# Patient Record
Sex: Female | Born: 1980 | Race: Black or African American | Hispanic: No | Marital: Single | Smoking: Never smoker
Health system: Southern US, Community
[De-identification: ages and names within clinical notes are randomized; demographics above are authoritative.]

## PROBLEM LIST (undated history)

## (undated) DIAGNOSIS — F411 Generalized anxiety disorder: Secondary | ICD-10-CM

## (undated) DIAGNOSIS — N6122 Granulomatous mastitis, left breast: Secondary | ICD-10-CM

## (undated) HISTORY — PX: BREAST CYST EXCISION: SHX579

## (undated) HISTORY — PX: BREAST CYST ASPIRATION: SHX578

## (undated) HISTORY — PX: BREAST EXCISIONAL BIOPSY: SUR124

## (undated) HISTORY — DX: Generalized anxiety disorder: F41.1

## (undated) HISTORY — DX: Granulomatous mastitis, left breast: N61.22

---

## 2017-05-21 ENCOUNTER — Other Ambulatory Visit: Payer: Self-pay | Admitting: Family Medicine

## 2017-05-21 ENCOUNTER — Other Ambulatory Visit: Payer: Self-pay

## 2017-05-21 DIAGNOSIS — N6310 Unspecified lump in the right breast, unspecified quadrant: Secondary | ICD-10-CM

## 2017-05-26 ENCOUNTER — Other Ambulatory Visit: Payer: Self-pay | Admitting: Physician Assistant

## 2017-05-26 DIAGNOSIS — N6310 Unspecified lump in the right breast, unspecified quadrant: Secondary | ICD-10-CM

## 2017-06-04 ENCOUNTER — Ambulatory Visit
Admission: RE | Admit: 2017-06-04 | Discharge: 2017-06-04 | Disposition: A | Discharge status: Home / Self Care | Payer: Medicaid Other | Source: Ambulatory Visit | Attending: Physician Assistant | Admitting: Physician Assistant

## 2017-06-04 ENCOUNTER — Other Ambulatory Visit: Payer: Self-pay | Admitting: Physician Assistant

## 2017-06-04 DIAGNOSIS — N6310 Unspecified lump in the right breast, unspecified quadrant: Secondary | ICD-10-CM

## 2017-06-04 DIAGNOSIS — N6001 Solitary cyst of right breast: Secondary | ICD-10-CM

## 2017-06-09 ENCOUNTER — Ambulatory Visit
Admission: RE | Admit: 2017-06-09 | Discharge: 2017-06-09 | Disposition: A | Discharge status: Home / Self Care | Payer: Medicaid Other | Source: Ambulatory Visit | Attending: Physician Assistant | Admitting: Physician Assistant

## 2017-06-09 DIAGNOSIS — N6001 Solitary cyst of right breast: Secondary | ICD-10-CM

## 2018-05-13 ENCOUNTER — Ambulatory Visit (HOSPITAL_COMMUNITY)
Admission: EM | Admit: 2018-05-13 | Discharge: 2018-05-13 | Disposition: A | Discharge status: Home / Self Care | Payer: Medicaid Other | Attending: Family Medicine | Admitting: Family Medicine

## 2018-05-13 ENCOUNTER — Encounter (HOSPITAL_COMMUNITY): Payer: Self-pay | Admitting: Emergency Medicine

## 2018-05-13 DIAGNOSIS — M5432 Sciatica, left side: Secondary | ICD-10-CM

## 2018-05-13 DIAGNOSIS — M5431 Sciatica, right side: Secondary | ICD-10-CM

## 2018-05-13 MED ORDER — IBUPROFEN 800 MG PO TABS: 800.0000 mg | ORAL_TABLET | Freq: Three times a day (TID) | ORAL | 0 refills | Status: DC

## 2018-05-13 MED ORDER — TIZANIDINE HCL 4 MG PO TABS: 4.0000 mg | ORAL_TABLET | Freq: Four times a day (QID) | ORAL | 0 refills | Status: DC | PRN

## 2018-05-13 MED ORDER — METHYLPREDNISOLONE ACETATE 80 MG/ML IJ SUSP
INTRAMUSCULAR | Status: AC
  Filled 2018-05-13: qty 1

## 2018-05-13 MED ORDER — METHYLPREDNISOLONE ACETATE 80 MG/ML IJ SUSP
80.0000 mg | Freq: Once | INTRAMUSCULAR | Status: AC
  Administered 2018-05-13: 80 mg via INTRAMUSCULAR

## 2018-05-13 MED FILL — IBUPROFEN 800 MG TAB: 800 | 7 days supply | Qty: 21 | Fill #0

## 2018-05-13 MED FILL — tiZANidine HCL 4 MG TABS: 4 | 2 days supply | Qty: 21 | Fill #0

## 2018-05-13 NOTE — Discharge Instructions (Addendum)
Ice or heat to back Activity as tolerated Take the ibuprofen 3 x a day with food as needed pain Take the tizanidine as needed muscle relaxer This helps at night See your doctor if you fail to improve

## 2018-05-13 NOTE — ED Triage Notes (Signed)
PT C/O: constant lower back pain onset 4 days that radiates down BLE.... Sts pain increases w/activity    DENIES: inj/trauma... 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 NAD... Ambulatory

## 2018-05-13 NOTE — ED Provider Notes (Signed)
[facility]    CSN: [id number] Arrival date & time: 05/13/18  1230     History   Chief Complaint Chief Complaint  Patient presents with  . Back Pain    HPI Kristin Smith is a 38 y.o. female.   HPI  No accident, fall or injury.  Is fit and exercises.  Has back pain for a few days with pain that radiates down the back of both legs to the knee.  Worse with movement.  Better with rest.  No numbness or weakness.  No known back problem. 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   History reviewed. No pertinent past medical history.  There are no active problems to display for this patient.   Past Surgical History:  Procedure Laterality Date  . BREAST CYST ASPIRATION Left   . BREAST CYST EXCISION Right   . BREAST EXCISIONAL BIOPSY Left    x2    OB History   No obstetric history on file.      Home Medications    Prior to Admission medications   Medication Sig Start Date End Date Taking? Authorizing Provider  sertraline (ZOLOFT) 100 MG tablet Take 100 mg by mouth daily.   Yes [provider]  ibuprofen (ADVIL,MOTRIN) 800 MG tablet Take 1 tablet (800 mg total) by mouth 3 (three) times daily. 05/13/18   Raylene Everts, MD  tiZANidine (ZANAFLEX) 4 MG tablet Take 1-2 tablets (4-8 mg total) by mouth every 6 (six) hours as needed for muscle spasms. 05/13/18   Raylene Everts, MD    Family History Family History  Problem Relation Age of Onset  . Breast cancer Neg Hx     Social History Social History   Tobacco Use  . Smoking status: Never Smoker  . Smokeless tobacco: Never Used  Substance Use Topics  . Alcohol use: Not on file  . Drug use: Not on file     Allergies   Patient has no known allergies.   Review of Systems Review of Systems  Constitutional: Negative for chills and fever.  HENT: Negative for ear pain and sore throat.   Eyes: Negative for pain and visual disturbance.  Respiratory: Negative for cough  and shortness of breath.   Cardiovascular: Negative for chest pain and palpitations.  Gastrointestinal: Negative for abdominal pain and vomiting.  Genitourinary: Negative for dysuria and hematuria.  Musculoskeletal: Positive for back pain. Negative for arthralgias.  Skin: Negative for color change and rash.  Neurological: Negative for seizures and syncope.  All other systems reviewed and are negative.    Physical Exam Triage Vital Signs ED Triage Vitals  Enc Vitals Group     BP 05/13/18 1341 127/86     Pulse Rate 05/13/18 1341 83     Resp 05/13/18 1341 16     Temp 05/13/18 1341 98 F (36.7 C)     Temp Source 05/13/18 1341 Tympanic     SpO2 05/13/18 1341 100 %     Weight --      Height --      Head Circumference --      Peak Flow --      Pain Score 05/13/18 1342 8     Pain Loc --      Pain Edu? --      Excl. in [place]? --    No data found.  Updated Vital Signs BP 127/86 (BP Location: Right Arm)   Pulse  83   Temp 98 F (36.7 C) (Tympanic)   Resp 16   LMP 04/24/2018   SpO2 100%   Visual Acuity Right Eye Distance:   Left Eye Distance:   Bilateral Distance:    Right Eye Near:   Left Eye Near:    Bilateral Near:     Physical Exam Constitutional:      General: She is not in acute distress.    Appearance: She is well-developed.  HENT:     Head: Normocephalic and atraumatic.  Eyes:     Conjunctiva/sclera: Conjunctivae normal.     Pupils: Pupils are equal, round, and reactive to light.  Neck:     Musculoskeletal: Normal range of motion.  Cardiovascular:     Rate and Rhythm: Normal rate.  Pulmonary:     Effort: Pulmonary effort is normal. No respiratory distress.  Abdominal:     General: There is no distension.     Palpations: Abdomen is soft.  Musculoskeletal: Normal range of motion.     Comments: Lumbar spine is straight and symmetric. Full, but slow, range of motion. No tenderness or muscle spasm. Strength, sensation, range of motion, and reflexes are normal  in both lower extremities. Straight leg raise is positive at full knee extension bilateral.   Skin:    General: Skin is warm and dry.  Neurological:     Mental Status: She is alert.      UC Treatments / Results  Labs (all labs ordered are listed, but only abnormal results are displayed) Labs Reviewed - No data to display  EKG None  Radiology No results found.  Procedures Procedures (including critical care time)  Medications Ordered in UC Medications  methylPREDNISolone acetate (DEPO-MEDROL) injection 80 mg (80 mg Intramuscular Given 05/13/18 1525)    Initial Impression / Assessment and Plan / UC Course  I have reviewed the triage vital signs and the nursing notes.  Pertinent labs & imaging results that were available during my care of the patient were reviewed by me and considered in my medical decision making (see chart for details).     Discussed spinal nerve inflammation.  Rest.  Conservative treatment. Final Clinical Impressions(s) / UC Diagnoses   Final diagnoses:  Bilateral sciatica     Discharge Instructions     Ice or heat to back Activity as tolerated Take the ibuprofen 3 x a day with food as needed pain Take the tizanidine as needed muscle relaxer This helps at night See your doctor if you fail to improve    ED Prescriptions    Medication Sig Dispense Auth. Provider   tiZANidine (ZANAFLEX) 4 MG tablet Take 1-2 tablets (4-8 mg total) by mouth every 6 (six) hours as needed for muscle spasms. 21 tablet Raylene Everts, MD   ibuprofen (ADVIL,MOTRIN) 800 MG tablet Take 1 tablet (800 mg total) by mouth 3 (three) times daily. 21 tablet Raylene Everts, MD     Controlled Substance Prescriptions [place] Controlled Substance Registry consulted? Not Applicable   Raylene Everts, MD 05/13/18 [phone]

## 2018-10-03 ENCOUNTER — Other Ambulatory Visit: Payer: Self-pay | Admitting: Obstetrics and Gynecology

## 2018-10-03 DIAGNOSIS — Z1231 Encounter for screening mammogram for malignant neoplasm of breast: Secondary | ICD-10-CM

## 2018-11-11 ENCOUNTER — Ambulatory Visit
Admission: RE | Admit: 2018-11-11 | Discharge: 2018-11-11 | Disposition: A | Discharge status: Home / Self Care | Payer: Medicaid Other | Source: Ambulatory Visit | Attending: Obstetrics and Gynecology | Admitting: Obstetrics and Gynecology

## 2018-11-11 ENCOUNTER — Other Ambulatory Visit: Payer: Self-pay

## 2018-11-11 DIAGNOSIS — Z1231 Encounter for screening mammogram for malignant neoplasm of breast: Secondary | ICD-10-CM

## 2019-02-21 ENCOUNTER — Other Ambulatory Visit: Payer: Self-pay

## 2019-02-21 DIAGNOSIS — Z20822 Contact with and (suspected) exposure to covid-19: Secondary | ICD-10-CM

## 2019-02-23 LAB — NOVEL CORONAVIRUS, NAA: SARS-CoV-2, NAA: NOT DETECTED

## 2019-09-25 DIAGNOSIS — D485 Neoplasm of uncertain behavior of skin: Secondary | ICD-10-CM | POA: Diagnosis not present

## 2019-12-19 ENCOUNTER — Ambulatory Visit: Payer: Medicaid Other | Admitting: Internal Medicine

## 2020-01-11 ENCOUNTER — Ambulatory Visit: Payer: Medicaid Other | Admitting: Internal Medicine

## 2020-02-06 DIAGNOSIS — Z9889 Other specified postprocedural states: Secondary | ICD-10-CM | POA: Diagnosis not present

## 2020-02-06 DIAGNOSIS — Z3042 Encounter for surveillance of injectable contraceptive: Secondary | ICD-10-CM | POA: Diagnosis not present

## 2020-02-06 DIAGNOSIS — Z124 Encounter for screening for malignant neoplasm of cervix: Secondary | ICD-10-CM | POA: Diagnosis not present

## 2020-02-06 DIAGNOSIS — Z01419 Encounter for gynecological examination (general) (routine) without abnormal findings: Secondary | ICD-10-CM | POA: Diagnosis not present

## 2020-02-06 DIAGNOSIS — Z1151 Encounter for screening for human papillomavirus (HPV): Secondary | ICD-10-CM | POA: Diagnosis not present

## 2020-02-06 DIAGNOSIS — Z3202 Encounter for pregnancy test, result negative: Secondary | ICD-10-CM | POA: Diagnosis not present

## 2020-02-22 ENCOUNTER — Encounter: Payer: Self-pay | Admitting: Internal Medicine

## 2020-02-22 ENCOUNTER — Ambulatory Visit (INDEPENDENT_AMBULATORY_CARE_PROVIDER_SITE_OTHER): Payer: 59 | Admitting: Internal Medicine

## 2020-02-22 ENCOUNTER — Other Ambulatory Visit: Payer: Self-pay

## 2020-02-22 VITALS — BP 120/80 | HR 88 | Temp 99.3°F | Ht 68.0 in | Wt 166.3 lb

## 2020-02-22 DIAGNOSIS — N6310 Unspecified lump in the right breast, unspecified quadrant: Secondary | ICD-10-CM

## 2020-02-22 DIAGNOSIS — R221 Localized swelling, mass and lump, neck: Secondary | ICD-10-CM | POA: Diagnosis not present

## 2020-02-22 DIAGNOSIS — N6122 Granulomatous mastitis, left breast: Secondary | ICD-10-CM | POA: Insufficient documentation

## 2020-02-22 DIAGNOSIS — F411 Generalized anxiety disorder: Secondary | ICD-10-CM | POA: Insufficient documentation

## 2020-02-22 NOTE — Addendum Note (Signed)
Addended by: Marrion Coy on: 02/22/2020 02:43 PM   Modules accepted: Orders

## 2020-02-22 NOTE — Progress Notes (Signed)
New Patient Office Visit     This visit occurred during the SARS-CoV-2 public health emergency.  Safety protocols were in place, including screening questions prior to the visit, additional usage of staff PPE, and extensive cleaning of exam room while observing appropriate contact time as indicated for disinfecting solutions.    CC/Reason for Visit: Establish care, discuss chronic conditions, discuss acute concerns Previous PCP: In [place] Last Visit: 2017  HPI: Kristin Smith is a 39 y.o. female who is coming in today for the above mentioned reasons. Past Medical History is significant for: History of generalized anxiety disorder well-controlled on 100 mg of Zoloft.  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  She is here today to establish care.  She has noticed the recurrence of a large breast lump in the 11 o'clock position right above the nipple.  She feels like this lump appeared quickly after resuming hormonal birth control in November.  She is concerned that it might be a recurrence of her granulomatous mastitis.  Her last breast imaging was in August 2020.  She has also noticed some lymph nodes in her anterior neck.  She feels like sometimes when she swallows her food gets stuck in the center of her neck.  She is in school [employer] to become a surgical tech.  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  She leads a very active lifestyle, she runs and weakness 5 days a week.  She has no known drug allergies, her family history significant for a father who had hypertension and hyperlipidemia.  There is no known breast cancer in the family that she is aware of.   Past Medical/Surgical History: Past Medical History:  Diagnosis Date  . GAD (generalized anxiety disorder)   . Granulomatous mastitis of left breast     Past  Surgical History:  Procedure Laterality Date  . BREAST CYST ASPIRATION Left   . BREAST CYST EXCISION Right   . BREAST EXCISIONAL BIOPSY Left    x2    Social History:  reports that she has never smoked. She has never used smokeless tobacco. She reports that she does not drink alcohol. No history on file for drug use.  Allergies: No Known Allergies  Family History:  Family History  Problem Relation Age of Onset  . Hypertension Father   . Hyperlipidemia Father   . Breast cancer Neg Hx      Current Outpatient Medications:  Marland Kitchen  Multiple Vitamin (MULTIVITAMIN) capsule, Take by mouth., Disp: , Rfl:  .  sertraline (ZOLOFT) 100 MG tablet, Take 100 mg by mouth daily., Disp: , Rfl:   Review of Systems:  Constitutional: Denies fever, chills, diaphoresis, appetite change and fatigue.  HEENT: Denies photophobia, eye pain, redness, hearing loss, ear pain, congestion, sore throat, rhinorrhea, sneezing, mouth sores, neck stiffness and tinnitus.   Respiratory: Denies SOB, DOE, cough, chest tightness,  and wheezing.   Cardiovascular: Denies chest pain, palpitations and leg swelling.  Gastrointestinal: Denies nausea, vomiting, abdominal pain, diarrhea, constipation, blood in stool and abdominal distention.  Genitourinary: Denies dysuria, urgency, frequency, hematuria, flank pain and difficulty urinating.  Endocrine: Denies: hot or cold intolerance, sweats, changes in hair or nails, polyuria, polydipsia. Musculoskeletal: Denies myalgias, back pain, joint swelling, arthralgias and gait problem.  Skin: Denies pallor, rash and wound.  Neurological: Denies  dizziness, seizures, syncope, weakness, light-headedness, numbness and headaches.  Hematological: Denies adenopathy. Easy bruising, personal or family bleeding history  Psychiatric/Behavioral: Denies suicidal ideation, mood changes, confusion, nervousness, sleep disturbance and agitation    Physical Exam: Vitals:   02/22/20 1353  BP: 120/80    Pulse: 88  Temp: 99.3 F (37.4 C)  TempSrc: Oral  SpO2: 97%  Weight: 166 lb 4.8 oz (75.4 kg)  Height: 5\' 8"  (1.727 m)   Body mass index is 25.29 kg/m.  Constitutional: NAD, calm, comfortable Eyes: PERRL, lids and conjunctivae normal ENMT: Mucous membranes are moist.  Neck: normal, supple, significant submandibular lymphadenopathy, some neck fullness Respiratory: clear to auscultation bilaterally, no wheezing, no crackles. Normal respiratory effort. No accessory muscle use.  Cardiovascular: Regular rate and rhythm, no murmurs / rubs / gallops. No extremity edema. Skin: Oval-shaped mass on her right breast above her nipple in the 11 o'clock position. Neurologic: Grossly intact and nonfocal Psychiatric: Normal judgment and insight. Alert and oriented x 3. Normal mood.    Impression and Plan:  Breast mass, right  -She will be sent for diagnostic imaging of her right breast, further work-up to follow.  Neck fullness  - Plan: TSH, T3, free, T4, free, US THYROID -If above normal, may consider referral to GI, question if she needs esophageal dilatation.  Granulomatous mastitis of left breast  GAD (generalized anxiety disorder) -Mood is stable on Zoloft 100 mg daily.    Patient Instructions  -Nice seeing you today!!  -Lab work today; will notify you once results are available.  -Neck ultrasound will be requested today.  -Schedule follow up in 6 months or sooner as needed.      Lelon Frohlich, MD [facility]

## 2020-02-22 NOTE — Patient Instructions (Signed)
-  Nice seeing you today!!  -Lab work today; will notify you once results are available.  -Neck ultrasound will be requested today.  -Schedule follow up in 6 months or sooner as needed.

## 2020-02-23 LAB — COMPREHENSIVE METABOLIC PANEL
AG Ratio: 1.4 (calc) (ref 1.0–2.5)
ALT: 16 U/L (ref 6–29)
AST: 20 U/L (ref 10–30)
Albumin: 4.4 g/dL (ref 3.6–5.1)
Alkaline phosphatase (APISO): 69 U/L (ref 31–125)
BUN: 12 mg/dL (ref 7–25)
CO2: 24 mmol/L (ref 20–32)
Calcium: 10.1 mg/dL (ref 8.6–10.2)
Chloride: 103 mmol/L (ref 98–110)
Creat: 0.88 mg/dL (ref 0.50–1.10)
Globulin: 3.2 g/dL (calc) (ref 1.9–3.7)
Glucose, Bld: 85 mg/dL (ref 65–99)
Potassium: 4.4 mmol/L (ref 3.5–5.3)
Sodium: 138 mmol/L (ref 135–146)
Total Bilirubin: 0.7 mg/dL (ref 0.2–1.2)
Total Protein: 7.6 g/dL (ref 6.1–8.1)

## 2020-02-23 LAB — CBC WITH DIFFERENTIAL/PLATELET
Absolute Monocytes: 628 cells/uL (ref 200–950)
Basophils Absolute: 36 cells/uL (ref 0–200)
Basophils Relative: 0.4 %
Eosinophils Absolute: 309 cells/uL (ref 15–500)
Eosinophils Relative: 3.4 %
HCT: 41.6 % (ref 35.0–45.0)
Hemoglobin: 13.5 g/dL (ref 11.7–15.5)
Lymphs Abs: 3804 cells/uL (ref 850–3900)
MCH: 27.3 pg (ref 27.0–33.0)
MCHC: 32.5 g/dL (ref 32.0–36.0)
MCV: 84.2 fL (ref 80.0–100.0)
MPV: 10.6 fL (ref 7.5–12.5)
Monocytes Relative: 6.9 %
Neutro Abs: 4323 cells/uL (ref 1500–7800)
Neutrophils Relative %: 47.5 %
Platelets: 320 10*3/uL (ref 140–400)
RBC: 4.94 10*6/uL (ref 3.80–5.10)
RDW: 12.9 % (ref 11.0–15.0)
Total Lymphocyte: 41.8 %
WBC: 9.1 10*3/uL (ref 3.8–10.8)

## 2020-02-23 LAB — TSH: TSH: 0.89 mIU/L

## 2020-02-23 LAB — T3, FREE: T3, Free: 3.2 pg/mL (ref 2.3–4.2)

## 2020-02-23 LAB — T4, FREE: Free T4: 1.2 ng/dL (ref 0.8–1.8)

## 2020-03-11 ENCOUNTER — Ambulatory Visit
Admission: RE | Admit: 2020-03-11 | Discharge: 2020-03-11 | Disposition: A | Discharge status: Home / Self Care | Payer: 59 | Source: Ambulatory Visit | Attending: Internal Medicine | Admitting: Internal Medicine

## 2020-03-11 DIAGNOSIS — R221 Localized swelling, mass and lump, neck: Secondary | ICD-10-CM | POA: Diagnosis not present

## 2020-03-14 ENCOUNTER — Other Ambulatory Visit: Payer: Self-pay

## 2020-03-14 ENCOUNTER — Other Ambulatory Visit: Payer: Self-pay | Admitting: Internal Medicine

## 2020-03-14 ENCOUNTER — Ambulatory Visit
Admission: RE | Admit: 2020-03-14 | Discharge: 2020-03-14 | Disposition: A | Discharge status: Home / Self Care | Payer: 59 | Source: Ambulatory Visit | Attending: Internal Medicine | Admitting: Internal Medicine

## 2020-03-14 DIAGNOSIS — N6310 Unspecified lump in the right breast, unspecified quadrant: Secondary | ICD-10-CM

## 2020-03-14 DIAGNOSIS — R922 Inconclusive mammogram: Secondary | ICD-10-CM | POA: Diagnosis not present

## 2020-03-14 DIAGNOSIS — N6489 Other specified disorders of breast: Secondary | ICD-10-CM | POA: Diagnosis not present

## 2020-03-14 LAB — HM MAMMOGRAPHY

## 2020-03-20 ENCOUNTER — Telehealth: Payer: Self-pay | Admitting: Internal Medicine

## 2020-03-20 NOTE — Telephone Encounter (Signed)
Patient is calling and requesting a refill for Zoloft sent to [facility] on [address], please advise. CB is [phone]

## 2020-03-21 MED ORDER — SERTRALINE HCL 100 MG PO TABS: 100.0000 mg | ORAL_TABLET | Freq: Every day | ORAL | 1 refills | Status: DC

## 2020-03-21 NOTE — Telephone Encounter (Signed)
Refill sent.

## 2020-03-27 ENCOUNTER — Ambulatory Visit
Admission: RE | Admit: 2020-03-27 | Discharge: 2020-03-27 | Disposition: A | Discharge status: Home / Self Care | Payer: 59 | Source: Ambulatory Visit | Attending: Internal Medicine | Admitting: Internal Medicine

## 2020-03-27 ENCOUNTER — Other Ambulatory Visit: Payer: Self-pay

## 2020-03-27 DIAGNOSIS — N6310 Unspecified lump in the right breast, unspecified quadrant: Secondary | ICD-10-CM

## 2020-03-27 DIAGNOSIS — N611 Abscess of the breast and nipple: Secondary | ICD-10-CM | POA: Diagnosis not present

## 2020-03-27 DIAGNOSIS — N6311 Unspecified lump in the right breast, upper outer quadrant: Secondary | ICD-10-CM | POA: Diagnosis not present

## 2020-04-02 ENCOUNTER — Other Ambulatory Visit: Payer: 59

## 2020-04-12 ENCOUNTER — Telehealth: Payer: Self-pay | Admitting: Internal Medicine

## 2020-04-12 DIAGNOSIS — Z1231 Encounter for screening mammogram for malignant neoplasm of breast: Secondary | ICD-10-CM

## 2020-04-12 NOTE — Telephone Encounter (Signed)
Patient is calling and wanted to see if the provider can refer her to a breast specialist, please advise. CB is [phone]

## 2020-04-16 NOTE — Telephone Encounter (Signed)
Referral placed.

## 2020-04-16 NOTE — Telephone Encounter (Signed)
Okay to order a mammogram?

## 2020-04-16 NOTE — Telephone Encounter (Signed)
Yes

## 2020-04-17 NOTE — Telephone Encounter (Signed)
Pt is calling in stating that she has had a mammogram, [place] and biospy last year and was given antibiotic by Dr.  Owens Shark and was told to contact her PCP is the lump does not go down.  Pt would like to have a call back to discuss the next step with the lump maybe getting a referral for removal.

## 2020-04-17 NOTE — Telephone Encounter (Signed)
Let's have her contact the breast center to see what their recommendations are. I have not received any recent notes from them.

## 2020-04-18 NOTE — Telephone Encounter (Signed)
Left a message for the pt to return my call.  

## 2020-04-18 NOTE — Telephone Encounter (Signed)
Patient is aware and will call the breast center for further recommendations.

## 2020-05-15 ENCOUNTER — Telehealth (INDEPENDENT_AMBULATORY_CARE_PROVIDER_SITE_OTHER): Payer: Self-pay | Admitting: Internal Medicine

## 2020-05-15 ENCOUNTER — Other Ambulatory Visit: Payer: Self-pay

## 2020-05-15 ENCOUNTER — Encounter: Payer: Self-pay | Admitting: Internal Medicine

## 2020-05-15 VITALS — Wt 175.0 lb

## 2020-05-15 DIAGNOSIS — Z9889 Other specified postprocedural states: Secondary | ICD-10-CM

## 2020-05-15 DIAGNOSIS — L700 Acne vulgaris: Secondary | ICD-10-CM

## 2020-05-15 NOTE — Progress Notes (Signed)
    Virtual Visit via Telephone Note  I connected with Kristin Smith on 05/15/20 at  4:00 PM EST by telephone and verified that I am speaking with the correct person using two identifiers.   I discussed the limitations, risks, security and privacy concerns of performing an evaluation and management service by telephone and the availability of in person appointments. I also discussed with the patient that there may be a patient responsible charge related to this service. The patient expressed understanding and agreed to proceed.  Location patient: home Location provider: work office Participants present for the call: patient, provider Patient did not have a visit in the prior 7 days to address this/these issue(s).   History of Present Illness:  She has signed up for the [employer] plan and needs referrals from PCP. She would like to see a dermatologist for acne and she would like to see a plastic surgeon to consider a breast lift. No issues today   Observations/Objective: Patient sounds cheerful and well on the phone. I do not appreciate any increased work of breathing. Speech and thought processing are grossly intact. Patient reported vitals: none reported   Current Outpatient Medications:  Marland Kitchen  Multiple Vitamin (MULTIVITAMIN) capsule, Take by mouth., Disp: , Rfl:  .  sertraline (ZOLOFT) 100 MG tablet, Take 1 tablet (100 mg total) by mouth daily., Disp: 90 tablet, Rfl: 1  Review of Systems:  Constitutional: Denies fever, chills, diaphoresis, appetite change and fatigue.  HEENT: Denies photophobia, eye pain, redness, hearing loss, ear pain, congestion, sore throat, rhinorrhea, sneezing, mouth sores, trouble swallowing, neck pain, neck stiffness and tinnitus.   Respiratory: Denies SOB, DOE, cough, chest tightness,  and wheezing.   Cardiovascular: Denies chest pain, palpitations and leg swelling.  Gastrointestinal: Denies nausea, vomiting, abdominal pain, diarrhea, constipation,  blood in stool and abdominal distention.  Genitourinary: Denies dysuria, urgency, frequency, hematuria, flank pain and difficulty urinating.  Endocrine: Denies: hot or cold intolerance, sweats, changes in hair or nails, polyuria, polydipsia. Musculoskeletal: Denies myalgias, back pain, joint swelling, arthralgias and gait problem.  Skin: Denies pallor, rash and wound.  Neurological: Denies dizziness, seizures, syncope, weakness, light-headedness, numbness and headaches.  Hematological: Denies adenopathy. Easy bruising, personal or family bleeding history  Psychiatric/Behavioral: Denies suicidal ideation, mood changes, confusion, nervousness, sleep disturbance and agitation   Assessment and Plan:  Acne vulgaris  - Plan: Ambulatory referral to Dermatology  Wanting cosmetic surgery of breast  - Plan: Ambulatory referral to Plastic Surgery    I discussed the assessment and treatment plan with the patient. The patient was provided an opportunity to ask questions and all were answered. The patient agreed with the plan and demonstrated an understanding of the instructions.   The patient was advised to call back or seek an in-person evaluation if the symptoms worsen or if the condition fails to improve as anticipated.  I provided 12 minutes of non-face-to-face time during this encounter.   Lelon Frohlich, MD [facility]

## 2020-05-17 ENCOUNTER — Encounter: Payer: Self-pay | Admitting: Internal Medicine

## 2020-05-23 ENCOUNTER — Other Ambulatory Visit (HOSPITAL_COMMUNITY): Payer: Self-pay | Admitting: Dermatology

## 2020-05-28 ENCOUNTER — Other Ambulatory Visit (HOSPITAL_COMMUNITY): Payer: Self-pay | Admitting: Dermatology

## 2020-05-31 MED FILL — SPIRONOLACTONE 25 MG TABS: 25 | 90 days supply | Qty: 360 | Fill #0

## 2020-06-04 MED FILL — TRETINOIN 0.05 % CREA: 0.05 | 30 days supply | Qty: 45 | Fill #0

## 2020-07-05 ENCOUNTER — Encounter: Payer: Self-pay | Admitting: Plastic Surgery

## 2020-07-05 ENCOUNTER — Other Ambulatory Visit: Payer: Self-pay

## 2020-07-05 ENCOUNTER — Ambulatory Visit (INDEPENDENT_AMBULATORY_CARE_PROVIDER_SITE_OTHER): Payer: Self-pay | Admitting: Plastic Surgery

## 2020-07-05 DIAGNOSIS — N62 Hypertrophy of breast: Secondary | ICD-10-CM | POA: Insufficient documentation

## 2020-07-05 NOTE — Progress Notes (Signed)
Patient ID: Kristin Smith, female    DOB: 03-22-1981, 40 y.o.   MRN: [id number]   Chief Complaint  Patient presents with  . Advice Only  . Breast Problem    Mammary Hyperplasia: The patient is a 40 y.o. female with a history of mammary hyperplasia for several years.  She has extremely large breasts causing symptoms that include the following: Back pain in the upper and lower back, including neck pain. She pulls or pins her bra straps to provide better lift and relief of the pressure and pain. She notices relief by holding her breast up manually.  Her shoulder straps cause grooves and pain and pressure that requires padding for relief. Pain medication is sometimes required with motrin and tylenol.  Activities that are hindered by enlarged breasts include: exercise and running.  She has tried supportive clothing as well as fitted bras without improvement.  Her breasts are extremely large and fairly symmetric.  She has hyperpigmentation of the inframammary area on both sides.  The sternal to nipple distance on the right is 27 cm and the left is 26 cm.  The IMF distance is 17 cm.  She is 5 feet 8 inches tall and weighs 173 pounds.  Preoperative bra size = 36 DD cup.  The estimated excess breast tissue to be removed at the time of surgery = 527 grams on the left and 527 grams on the right.  Mammogram history: Her last mammogram was December 2021 she has had cysts and abscesses in the past that have been drained.  She has a scar around her left areola.  She has some hypertrophic scarring so she would have to be prepared for that after any surgery..  Family history of breast cancer: None.  Tobacco use: None.   Review of Systems  Constitutional: Negative.  Negative for activity change and appetite change.  Eyes: Negative.   Respiratory: Negative.  Negative for chest tightness and shortness of breath.   Cardiovascular: Negative for leg swelling.  Gastrointestinal: Negative for abdominal distention  and abdominal pain.  Endocrine: Negative.   Genitourinary: Negative.   Musculoskeletal: Positive for back pain and neck pain.  Skin: Negative.   Hematological: Negative.   Psychiatric/Behavioral: Negative.     Past Medical History:  Diagnosis Date  . GAD (generalized anxiety disorder)   . Granulomatous mastitis of left breast     Past Surgical History:  Procedure Laterality Date  . BREAST CYST ASPIRATION Left   . BREAST CYST EXCISION Right   . BREAST EXCISIONAL BIOPSY Left    x2      Current Outpatient Medications:  Marland Kitchen  Multiple Vitamin (MULTIVITAMIN) capsule, Take by mouth., Disp: , Rfl:  .  sertraline (ZOLOFT) 100 MG tablet, Take 1 tablet (100 mg total) by mouth daily., Disp: 90 tablet, Rfl: 1 .  spironolactone (ALDACTONE) 25 MG tablet, Take 2 tablets by mouth 2 (two) times daily., Disp: , Rfl:    Objective:   Vitals:   07/05/20 1200  BP: 119/82  Pulse: 76  SpO2: 99%    Physical Exam Vitals and nursing note reviewed.  Constitutional:      Appearance: Normal appearance.  HENT:     Head: Normocephalic and atraumatic.  Cardiovascular:     Rate and Rhythm: Normal rate.     Pulses: Normal pulses.  Pulmonary:     Effort: Pulmonary effort is normal. No respiratory distress.  Abdominal:     General: Abdomen is flat. There is no distension.  Musculoskeletal:        General: No swelling or deformity.  Skin:    General: Skin is warm.     Capillary Refill: Capillary refill takes less than 2 seconds.  Neurological:     General: No focal deficit present.     Mental Status: She is alert and oriented to person, place, and time.  Psychiatric:        Mood and Affect: Mood normal.        Behavior: Behavior normal.     Assessment & Plan:  Symptomatic mammary hypertrophy  The patient is interested in bilateral breast reduction.  She does not want to go to small but complains about the pain and excess stretch on her neck and upper back.  She is a good candidate for  bilateral breast reduction.  Pictures were obtained of the patient and placed in the chart with the patient's or guardian's permission.   Yankee Hill, DO

## 2020-08-20 ENCOUNTER — Other Ambulatory Visit (HOSPITAL_COMMUNITY): Payer: Self-pay

## 2020-08-20 MED ORDER — TRETINOIN 0.05 % EX CREA
TOPICAL_CREAM | CUTANEOUS | 3 refills | Status: DC
  Filled 2020-08-20: qty 45, 30d supply, fill #0
  Filled 2020-11-13: qty 45, 30d supply, fill #1
  Filled 2021-01-08: qty 45, 30d supply, fill #2
  Filled 2021-07-31: qty 45, 30d supply, fill #3

## 2020-08-20 MED ORDER — DAPSONE 5 % EX GEL
Freq: Two times a day (BID) | CUTANEOUS | 3 refills | Status: AC
  Filled 2020-08-20: qty 90, 90d supply, fill #0
  Filled 2020-11-13: qty 90, 90d supply, fill #1

## 2020-08-20 MED ORDER — SPIRONOLACTONE 50 MG PO TABS
ORAL_TABLET | ORAL | 3 refills | Status: AC
  Filled 2020-08-20: qty 90, 30d supply, fill #0
  Filled 2020-11-13: qty 90, 30d supply, fill #1
  Filled 2021-01-08: qty 90, 30d supply, fill #2

## 2020-08-21 ENCOUNTER — Other Ambulatory Visit (HOSPITAL_COMMUNITY): Payer: Self-pay

## 2020-08-22 ENCOUNTER — Other Ambulatory Visit (HOSPITAL_COMMUNITY): Payer: Self-pay

## 2020-08-23 ENCOUNTER — Other Ambulatory Visit (HOSPITAL_COMMUNITY): Payer: Self-pay

## 2020-08-23 ENCOUNTER — Ambulatory Visit (INDEPENDENT_AMBULATORY_CARE_PROVIDER_SITE_OTHER): Payer: No Typology Code available for payment source | Admitting: Internal Medicine

## 2020-08-23 ENCOUNTER — Encounter: Payer: Self-pay | Admitting: Internal Medicine

## 2020-08-23 ENCOUNTER — Other Ambulatory Visit: Payer: Self-pay

## 2020-08-23 VITALS — BP 110/80 | HR 107 | Temp 98.5°F | Wt 170.9 lb

## 2020-08-23 DIAGNOSIS — F411 Generalized anxiety disorder: Secondary | ICD-10-CM

## 2020-08-23 MED ORDER — SERTRALINE HCL 100 MG PO TABS
100.0000 mg | ORAL_TABLET | Freq: Every day | ORAL | 1 refills | Status: DC
  Filled 2020-08-23: qty 90, 90d supply, fill #0
  Filled 2021-01-08: qty 90, 90d supply, fill #1

## 2020-08-23 NOTE — Progress Notes (Signed)
Established Patient Office Visit     This visit occurred during the SARS-CoV-2 public health emergency.  Safety protocols were in place, including screening questions prior to the visit, additional usage of staff PPE, and extensive cleaning of exam room while observing appropriate contact time as indicated for disinfecting solutions.    CC/Reason for Visit: Follow-up chronic conditions  HPI: Kristin Smith is a 40 y.o. female who is coming in today for the above mentioned reasons. Past Medical History is significant for: Generalized anxiety disorder that is well controlled on sertraline.  She was recently started on spironolactone for acne by her dermatologist.  She also saw Dr. Marla Roe to consider a breast reduction.  She tells me this is scheduled for mid June.  She has no acute complaints today and has been feeling well.   Past Medical/Surgical History: Past Medical History:  Diagnosis Date  . GAD (generalized anxiety disorder)   . Granulomatous mastitis of left breast     Past Surgical History:  Procedure Laterality Date  . BREAST CYST ASPIRATION Left   . BREAST CYST EXCISION Right   . BREAST EXCISIONAL BIOPSY Left    x2    Social History:  reports that she has never smoked. She has never used smokeless tobacco. She reports that she does not drink alcohol. No history on file for drug use.  Allergies: No Known Allergies  Family History:  Family History  Problem Relation Age of Onset  . Hypertension Father   . Hyperlipidemia Father   . Breast cancer Neg Hx      Current Outpatient Medications:  .  Dapsone 5 % topical gel, Apply twice daily to affected area., Disp: 90 g, Rfl: 3 .  Multiple Vitamin (MULTIVITAMIN) capsule, Take by mouth., Disp: , Rfl:  .  spironolactone (ALDACTONE) 50 MG tablet, Take 2 tablets by mouth in the morning and 1 tablet at night., Disp: 90 tablet, Rfl: 3 .  tretinoin (RETIN-A) 0.05 % cream, Apply a thin amount to affected area every  evening, Disp: 45 g, Rfl: 3 .  tretinoin (RETIN-A) 0.05 % cream, APPLY A SMALL AMOUNT ONTO AFFECTED AREA EACH EVENING, Disp: 45 g, Rfl: 3 .  sertraline (ZOLOFT) 100 MG tablet, Take 1 tablet (100 mg total) by mouth daily., Disp: 90 tablet, Rfl: 1  Review of Systems:  Constitutional: Denies fever, chills, diaphoresis, appetite change and fatigue.  HEENT: Denies photophobia, eye pain, redness, hearing loss, ear pain, congestion, sore throat, rhinorrhea, sneezing, mouth sores, trouble swallowing, neck pain, neck stiffness and tinnitus.   Respiratory: Denies SOB, DOE, cough, chest tightness,  and wheezing.   Cardiovascular: Denies chest pain, palpitations and leg swelling.  Gastrointestinal: Denies nausea, vomiting, abdominal pain, diarrhea, constipation, blood in stool and abdominal distention.  Genitourinary: Denies dysuria, urgency, frequency, hematuria, flank pain and difficulty urinating.  Endocrine: Denies: hot or cold intolerance, sweats, changes in hair or nails, polyuria, polydipsia. Musculoskeletal: Denies myalgias, back pain, joint swelling, arthralgias and gait problem.  Skin: Denies pallor, rash and wound.  Neurological: Denies dizziness, seizures, syncope, weakness, light-headedness, numbness and headaches.  Hematological: Denies adenopathy. Easy bruising, personal or family bleeding history  Psychiatric/Behavioral: Denies suicidal ideation, mood changes, confusion, nervousness, sleep disturbance and agitation    Physical Exam: Vitals:   08/23/20 1334  BP: 110/80  Pulse: (!) 107  Temp: 98.5 F (36.9 C)  TempSrc: Oral  SpO2: 97%  Weight: 170 lb 14.4 oz (77.5 kg)    Body mass index is 25.61 kg/m.  Constitutional: NAD, calm, comfortable Eyes: PERRL, lids and conjunctivae normal ENMT: Mucous membranes are moist.  Respiratory: clear to auscultation bilaterally, no wheezing, no crackles. Normal respiratory effort. No accessory muscle use.  Cardiovascular: Regular rate and  rhythm, no murmurs / rubs / gallops. No extremity edema.  Neurologic: Grossly intact and nonfocal Psychiatric: Normal judgment and insight. Alert and oriented x 3. Normal mood.    Impression and Plan:  GAD (generalized anxiety disorder) Flowsheet Row Office Visit from 08/23/2020 in [facility] at [facility]  PHQ-9 Total Score 0     -Mood is stable, refill sertraline 100 mg daily.    Lelon Frohlich, MD [facility]

## 2020-09-03 ENCOUNTER — Other Ambulatory Visit: Payer: Self-pay

## 2020-09-03 ENCOUNTER — Ambulatory Visit (INDEPENDENT_AMBULATORY_CARE_PROVIDER_SITE_OTHER): Payer: No Typology Code available for payment source | Admitting: Surgical

## 2020-09-03 ENCOUNTER — Other Ambulatory Visit (HOSPITAL_COMMUNITY): Payer: Self-pay

## 2020-09-03 ENCOUNTER — Encounter: Payer: Self-pay | Admitting: Surgical

## 2020-09-03 VITALS — BP 125/88 | HR 78 | Ht 68.0 in | Wt 168.4 lb

## 2020-09-03 DIAGNOSIS — N62 Hypertrophy of breast: Secondary | ICD-10-CM

## 2020-09-03 MED ORDER — ONDANSETRON HCL 4 MG PO TABS
4.0000 mg | ORAL_TABLET | Freq: Three times a day (TID) | ORAL | 0 refills | Status: DC | PRN
  Filled 2020-09-03 – 2020-09-19 (×2): qty 20, 7d supply, fill #0

## 2020-09-03 MED ORDER — TRAMADOL HCL 50 MG PO TABS
50.0000 mg | ORAL_TABLET | Freq: Four times a day (QID) | ORAL | 0 refills | Status: AC | PRN
  Filled 2020-09-03 – 2020-09-19 (×2): qty 20, 5d supply, fill #0

## 2020-09-03 MED ORDER — CEPHALEXIN 500 MG PO CAPS
500.0000 mg | ORAL_CAPSULE | Freq: Four times a day (QID) | ORAL | 0 refills | Status: AC
  Filled 2020-09-03 – 2020-09-19 (×2): qty 12, 3d supply, fill #0

## 2020-09-03 NOTE — H&P (View-Only) (Signed)
Patient ID: Kristin Smith, female    DOB: 1980-07-27, 40 y.o.   MRN: [id number]  Chief Complaint  Patient presents with  . Pre-op Exam      ICD-10-CM   1. Symptomatic mammary hypertrophy  N62      History of Present Illness: Kristin Smith is a 40 y.o.  female  with a history of macromastia.  She presents for preoperative evaluation for upcoming procedure, Bilateral Breast Reduction w/ possible liposuction, scheduled for 09/19/20 with Dr.  Marla Roe  The patient has not had problems with anesthesia. No history of DVT/PE.  No family history of DVT/PE.  No family or personal history of bleeding or clotting disorders.  Patient is not currently taking any blood thinners.  No history of CVA/MI.   Patient with history of hypertrophic scarring.  Estimated excess breast tissue to be removed at time of surgery: 575 on the left and 575 on the right. She reports she would like to be approximately C/D cup.  Job: Nurse tech in PACU  PMH Significant for: Granulomatous mastitis of right breast which has previously been followed by general surgery.  She recently saw general surgery on 08/29/2020 and has not had any issues per EMR review.   Past Medical History: Allergies: No Known Allergies  Current Medications:  Current Outpatient Medications:  .  cephALEXin (KEFLEX) 500 MG capsule, Take 1 capsule (500 mg total) by mouth 4 (four) times daily for 3 days., Disp: 12 capsule, Rfl: 0 .  Dapsone 5 % topical gel, Apply twice daily to affected area., Disp: 90 g, Rfl: 3 .  Multiple Vitamin (MULTIVITAMIN) capsule, Take by mouth., Disp: , Rfl:  .  ondansetron (ZOFRAN) 4 MG tablet, Take 1 tablet (4 mg total) by mouth every 8 (eight) hours as needed for nausea or vomiting., Disp: 20 tablet, Rfl: 0 .  sertraline (ZOLOFT) 100 MG tablet, Take 1 tablet (100 mg total) by mouth daily., Disp: 90 tablet, Rfl: 1 .  spironolactone (ALDACTONE) 50 MG tablet, Take 2 tablets by mouth in the morning and 1 tablet at  night., Disp: 90 tablet, Rfl: 3 .  traMADol (ULTRAM) 50 MG tablet, Take 1 tablet (50 mg total) by mouth every 6 (six) hours as needed for up to 5 days for severe pain., Disp: 20 tablet, Rfl: 0 .  tretinoin (RETIN-A) 0.05 % cream, Apply a thin amount to affected area every evening, Disp: 45 g, Rfl: 3 .  tretinoin (RETIN-A) 0.05 % cream, APPLY A SMALL AMOUNT ONTO AFFECTED AREA EACH EVENING, Disp: 45 g, Rfl: 3  Past Medical Problems: Past Medical History:  Diagnosis Date  . GAD (generalized anxiety disorder)   . Granulomatous mastitis of left breast     Past Surgical History: Past Surgical History:  Procedure Laterality Date  . BREAST CYST ASPIRATION Left   . BREAST CYST EXCISION Right   . BREAST EXCISIONAL BIOPSY Left    x2    Social History: Social History   Socioeconomic History  . Marital status: Single    Spouse name: Not on file  . Number of children: Not on file  . Years of education: Not on file  . Highest education level: Not on file  Occupational History  . Not on file  Tobacco Use  . Smoking status: Never Smoker  . Smokeless tobacco: Never Used  Substance and Sexual Activity  . Alcohol use: Never  . Drug use: Not on file  . Sexual activity: Not on file  Other Topics  Concern  . Not on file  Social History Narrative  . Not on file   Social Determinants of Health   Financial Resource Strain: Not on file  Food Insecurity: Not on file  Transportation Needs: Not on file  Physical Activity: Not on file  Stress: Not on file  Social Connections: Not on file  Intimate Partner Violence: Not on file    Family History: Family History  Problem Relation Age of Onset  . Hypertension Father   . Hyperlipidemia Father   . Breast cancer Neg Hx     Review of Systems: Review of Systems  Constitutional: Negative.   Cardiovascular: Negative.   Gastrointestinal: Negative.   Genitourinary: Negative.   Neurological: Negative.     Physical Exam: Vital Signs BP  125/88 (BP Location: Right Arm, Patient Position: Sitting, Cuff Size: Large)   Pulse 78   Ht 5\' 8"  (1.727 m)   Wt 168 lb 6.4 oz (76.4 kg)   SpO2 98%   BMI 25.61 kg/m   Physical Exam  Constitutional:      General: Not in acute distress.    Appearance: Normal appearance. Not ill-appearing.  HENT:     Head: Normocephalic and atraumatic.  Eyes:     Pupils: Pupils are equal, round Neck:     Musculoskeletal: Normal range of motion.  Cardiovascular:     Rate and Rhythm: Normal rate    Pulses: Normal pulses.  Pulmonary:     Effort: Pulmonary effort is normal. No respiratory distress.  Abdominal:     General: Abdomen is flat Musculoskeletal: Normal range of motion.  Skin:    General: Skin is warm and dry.     Findings: No erythema or rash.  Neurological:     General: No focal deficit present.     Mental Status: Alert and oriented to person, place, and time. Mental status is at baseline.     Motor: No weakness.  Psychiatric:        Mood and Affect: Mood normal.        Behavior: Behavior normal.    Assessment/Plan: The patient is scheduled for bilateral breast reduction with possible liposuction with Dr. Marla Roe.  Risks, benefits, and alternatives of procedure discussed, questions answered and consent obtained.    Smoking Status: Non-smoker; Counseling Given?  N/A Last Mammogram: 03/2020; Results: Indeterminate mass in the upper outer quadrant of the right breast with subsequent ultrasound and biopsy which showed resolving abscess of the right breast.  No malignancy noted.  Caprini Score: 2, low risk; Risk Factors include: Age and length of planned surgery. Recommendation for mechanical and pharmacological prophylaxis while hospitalized. Encourage early ambulation.   Pictures obtained: At consult  Post-op Rx sent to pharmacy: Norco, Zofran, Keflex  Patient was provided with the breast reduction and General Surgical Risk consent document and Pain Medication Agreement prior to  their appointment.  They had adequate time to read through the risk consent documents and Pain Medication Agreement. We also discussed them in person together during this preop appointment. All of their questions were answered to their satisfaction.  Recommended calling if they have any further questions.  Risk consent form and Pain Medication Agreement to be scanned into patient's chart.  The risk that can be encountered with breast reduction were discussed and include the following but not limited to these:  Breast asymmetry, fluid accumulation, firmness of the breast, inability to breast feed, loss of nipple or areola, skin loss, decrease or no nipple sensation, fat necrosis of  the breast tissue, bleeding, infection, healing delay.  There are risks of anesthesia, changes to skin sensation and injury to nerves or blood vessels.  The muscle can be temporarily or permanently injured.  You may have an allergic reaction to tape, suture, glue, blood products which can result in skin discoloration, swelling, pain, skin lesions, poor healing.  Any of these can lead to the need for revisonal surgery or stage procedures.  A reduction has potential to interfere with diagnostic procedures.  Nipple or breast piercing can increase risks of infection.  This procedure is best done when the breast is fully developed.  Changes in the breast will continue to occur over time.  Pregnancy can alter the outcomes of previous breast reduction surgery, weight gain and weigh loss can also effect the long term appearance.   The risks that can be encountered with and after liposuction were discussed and include the following but no limited to these:  Asymmetry, fluid accumulation, firmness of the area, fat necrosis with death of fat tissue, bleeding, infection, delayed healing, anesthesia risks, skin sensation changes, injury to structures including nerves, blood vessels, and muscles which may be temporary or permanent, allergies to  tape, suture materials and glues, blood products, topical preparations or injected agents, skin and contour irregularities, skin discoloration and swelling, deep vein thrombosis, cardiac and pulmonary complications, pain, which may persist, persistent pain, recurrence of the lesion, poor healing of the incision, possible need for revisional surgery or staged procedures. Thiere can also be persistent swelling, poor wound healing, rippling or loose skin, worsening of cellulite, swelling, and thermal burn or heat injury from ultrasound with the ultrasound-assisted lipoplasty technique. Any change in weight fluctuations can alter the outcome.  Patient aware that she is potentially going to have hypertrophic scarring again.  She is prepared for that after surgery.  We have discussed options for decreasing this with silicone scar patches and silicone scar creams  Electronically signed by: Carola Rhine Osamu Olguin, PA-C 09/03/2020 9:22 AM

## 2020-09-03 NOTE — Progress Notes (Signed)
Patient ID: Kristin Smith, female    DOB: Sep 19, 1980, 40 y.o.   MRN: [id number]  Chief Complaint  Patient presents with  . Pre-op Exam      ICD-10-CM   1. Symptomatic mammary hypertrophy  N62      History of Present Illness: Kristin Smith is a 40 y.o.  female  with a history of macromastia.  She presents for preoperative evaluation for upcoming procedure, Bilateral Breast Reduction w/ possible liposuction, scheduled for 09/19/20 with Dr.  Marla Roe  The patient has not had problems with anesthesia. No history of DVT/PE.  No family history of DVT/PE.  No family or personal history of bleeding or clotting disorders.  Patient is not currently taking any blood thinners.  No history of CVA/MI.   Patient with history of hypertrophic scarring.  Estimated excess breast tissue to be removed at time of surgery: 575 on the left and 575 on the right. She reports she would like to be approximately C/D cup.  Job: Nurse tech in PACU  PMH Significant for: Granulomatous mastitis of right breast which has previously been followed by general surgery.  She recently saw general surgery on 08/29/2020 and has not had any issues per EMR review.   Past Medical History: Allergies: No Known Allergies  Current Medications:  Current Outpatient Medications:  .  cephALEXin (KEFLEX) 500 MG capsule, Take 1 capsule (500 mg total) by mouth 4 (four) times daily for 3 days., Disp: 12 capsule, Rfl: 0 .  Dapsone 5 % topical gel, Apply twice daily to affected area., Disp: 90 g, Rfl: 3 .  Multiple Vitamin (MULTIVITAMIN) capsule, Take by mouth., Disp: , Rfl:  .  ondansetron (ZOFRAN) 4 MG tablet, Take 1 tablet (4 mg total) by mouth every 8 (eight) hours as needed for nausea or vomiting., Disp: 20 tablet, Rfl: 0 .  sertraline (ZOLOFT) 100 MG tablet, Take 1 tablet (100 mg total) by mouth daily., Disp: 90 tablet, Rfl: 1 .  spironolactone (ALDACTONE) 50 MG tablet, Take 2 tablets by mouth in the morning and 1 tablet at  night., Disp: 90 tablet, Rfl: 3 .  traMADol (ULTRAM) 50 MG tablet, Take 1 tablet (50 mg total) by mouth every 6 (six) hours as needed for up to 5 days for severe pain., Disp: 20 tablet, Rfl: 0 .  tretinoin (RETIN-A) 0.05 % cream, Apply a thin amount to affected area every evening, Disp: 45 g, Rfl: 3 .  tretinoin (RETIN-A) 0.05 % cream, APPLY A SMALL AMOUNT ONTO AFFECTED AREA EACH EVENING, Disp: 45 g, Rfl: 3  Past Medical Problems: Past Medical History:  Diagnosis Date  . GAD (generalized anxiety disorder)   . Granulomatous mastitis of left breast     Past Surgical History: Past Surgical History:  Procedure Laterality Date  . BREAST CYST ASPIRATION Left   . BREAST CYST EXCISION Right   . BREAST EXCISIONAL BIOPSY Left    x2    Social History: Social History   Socioeconomic History  . Marital status: Single    Spouse name: Not on file  . Number of children: Not on file  . Years of education: Not on file  . Highest education level: Not on file  Occupational History  . Not on file  Tobacco Use  . Smoking status: Never Smoker  . Smokeless tobacco: Never Used  Substance and Sexual Activity  . Alcohol use: Never  . Drug use: Not on file  . Sexual activity: Not on file  Other Topics  Concern  . Not on file  Social History Narrative  . Not on file   Social Determinants of Health   Financial Resource Strain: Not on file  Food Insecurity: Not on file  Transportation Needs: Not on file  Physical Activity: Not on file  Stress: Not on file  Social Connections: Not on file  Intimate Partner Violence: Not on file    Family History: Family History  Problem Relation Age of Onset  . Hypertension Father   . Hyperlipidemia Father   . Breast cancer Neg Hx     Review of Systems: Review of Systems  Constitutional: Negative.   Cardiovascular: Negative.   Gastrointestinal: Negative.   Genitourinary: Negative.   Neurological: Negative.     Physical Exam: Vital Signs BP  125/88 (BP Location: Right Arm, Patient Position: Sitting, Cuff Size: Large)   Pulse 78   Ht 5\' 8"  (1.727 m)   Wt 168 lb 6.4 oz (76.4 kg)   SpO2 98%   BMI 25.61 kg/m   Physical Exam  Constitutional:      General: Not in acute distress.    Appearance: Normal appearance. Not ill-appearing.  HENT:     Head: Normocephalic and atraumatic.  Eyes:     Pupils: Pupils are equal, round Neck:     Musculoskeletal: Normal range of motion.  Cardiovascular:     Rate and Rhythm: Normal rate    Pulses: Normal pulses.  Pulmonary:     Effort: Pulmonary effort is normal. No respiratory distress.  Abdominal:     General: Abdomen is flat Musculoskeletal: Normal range of motion.  Skin:    General: Skin is warm and dry.     Findings: No erythema or rash.  Neurological:     General: No focal deficit present.     Mental Status: Alert and oriented to person, place, and time. Mental status is at baseline.     Motor: No weakness.  Psychiatric:        Mood and Affect: Mood normal.        Behavior: Behavior normal.    Assessment/Plan: The patient is scheduled for bilateral breast reduction with possible liposuction with Dr. Marla Roe.  Risks, benefits, and alternatives of procedure discussed, questions answered and consent obtained.    Smoking Status: Non-smoker; Counseling Given?  N/A Last Mammogram: 03/2020; Results: Indeterminate mass in the upper outer quadrant of the right breast with subsequent ultrasound and biopsy which showed resolving abscess of the right breast.  No malignancy noted.  Caprini Score: 2, low risk; Risk Factors include: Age and length of planned surgery. Recommendation for mechanical and pharmacological prophylaxis while hospitalized. Encourage early ambulation.   Pictures obtained: At consult  Post-op Rx sent to pharmacy: Norco, Zofran, Keflex  Patient was provided with the breast reduction and General Surgical Risk consent document and Pain Medication Agreement prior to  their appointment.  They had adequate time to read through the risk consent documents and Pain Medication Agreement. We also discussed them in person together during this preop appointment. All of their questions were answered to their satisfaction.  Recommended calling if they have any further questions.  Risk consent form and Pain Medication Agreement to be scanned into patient's chart.  The risk that can be encountered with breast reduction were discussed and include the following but not limited to these:  Breast asymmetry, fluid accumulation, firmness of the breast, inability to breast feed, loss of nipple or areola, skin loss, decrease or no nipple sensation, fat necrosis of  the breast tissue, bleeding, infection, healing delay.  There are risks of anesthesia, changes to skin sensation and injury to nerves or blood vessels.  The muscle can be temporarily or permanently injured.  You may have an allergic reaction to tape, suture, glue, blood products which can result in skin discoloration, swelling, pain, skin lesions, poor healing.  Any of these can lead to the need for revisonal surgery or stage procedures.  A reduction has potential to interfere with diagnostic procedures.  Nipple or breast piercing can increase risks of infection.  This procedure is best done when the breast is fully developed.  Changes in the breast will continue to occur over time.  Pregnancy can alter the outcomes of previous breast reduction surgery, weight gain and weigh loss can also effect the long term appearance.   The risks that can be encountered with and after liposuction were discussed and include the following but no limited to these:  Asymmetry, fluid accumulation, firmness of the area, fat necrosis with death of fat tissue, bleeding, infection, delayed healing, anesthesia risks, skin sensation changes, injury to structures including nerves, blood vessels, and muscles which may be temporary or permanent, allergies to  tape, suture materials and glues, blood products, topical preparations or injected agents, skin and contour irregularities, skin discoloration and swelling, deep vein thrombosis, cardiac and pulmonary complications, pain, which may persist, persistent pain, recurrence of the lesion, poor healing of the incision, possible need for revisional surgery or staged procedures. Thiere can also be persistent swelling, poor wound healing, rippling or loose skin, worsening of cellulite, swelling, and thermal burn or heat injury from ultrasound with the ultrasound-assisted lipoplasty technique. Any change in weight fluctuations can alter the outcome.  Patient aware that she is potentially going to have hypertrophic scarring again.  She is prepared for that after surgery.  We have discussed options for decreasing this with silicone scar patches and silicone scar creams  Electronically signed by: Carola Rhine Mathews Stuhr, PA-C 09/03/2020 9:22 AM

## 2020-09-10 ENCOUNTER — Other Ambulatory Visit: Payer: Self-pay

## 2020-09-10 ENCOUNTER — Encounter (HOSPITAL_BASED_OUTPATIENT_CLINIC_OR_DEPARTMENT_OTHER): Payer: Self-pay | Admitting: Plastic Surgery

## 2020-09-11 ENCOUNTER — Other Ambulatory Visit (HOSPITAL_COMMUNITY): Payer: Self-pay

## 2020-09-17 ENCOUNTER — Other Ambulatory Visit (HOSPITAL_COMMUNITY): Payer: No Typology Code available for payment source

## 2020-09-18 NOTE — Anesthesia Preprocedure Evaluation (Addendum)
Anesthesia Evaluation  Patient identified by MRN, date of birth, ID band Patient awake    Reviewed: Allergy & Precautions, NPO status , Patient's Chart, lab work & pertinent test results  Airway Mallampati: I       Dental no notable dental hx.    Pulmonary neg pulmonary ROS,    Pulmonary exam normal        Cardiovascular negative cardio ROS Normal cardiovascular exam     Neuro/Psych PSYCHIATRIC DISORDERS Anxiety negative neurological ROS     GI/Hepatic negative GI ROS, Neg liver ROS,   Endo/Other  negative endocrine ROS  Renal/GU negative Renal ROS  negative genitourinary   Musculoskeletal negative musculoskeletal ROS (+)   Abdominal Normal abdominal exam  (+)   Peds  Hematology negative hematology ROS (+)   Anesthesia Other Findings   Reproductive/Obstetrics negative OB ROS                            Anesthesia Physical Anesthesia Plan  ASA: 2  Anesthesia Plan: General   Post-op Pain Management:    Induction: Intravenous  PONV Risk Score and Plan: 4 or greater and Ondansetron, Dexamethasone and Midazolam  Airway Management Planned: LMA  Additional Equipment: None  Intra-op Plan:   Post-operative Plan: Extubation in OR  Informed Consent: I have reviewed the patients History and Physical, chart, labs and discussed the procedure including the risks, benefits and alternatives for the proposed anesthesia with the patient or authorized representative who has indicated his/her understanding and acceptance.     Dental advisory given  Plan Discussed with: CRNA  Anesthesia Plan Comments:        Anesthesia Quick Evaluation

## 2020-09-19 ENCOUNTER — Encounter (HOSPITAL_BASED_OUTPATIENT_CLINIC_OR_DEPARTMENT_OTHER)
Admission: RE | Disposition: A | Discharge status: Home / Self Care | Payer: Self-pay | Source: Home / Self Care | Attending: Plastic Surgery

## 2020-09-19 ENCOUNTER — Ambulatory Visit (HOSPITAL_BASED_OUTPATIENT_CLINIC_OR_DEPARTMENT_OTHER)
Admission: RE | Admit: 2020-09-19 | Discharge: 2020-09-19 | Disposition: A | Discharge status: Home / Self Care | Payer: No Typology Code available for payment source | Attending: Plastic Surgery | Admitting: Plastic Surgery

## 2020-09-19 ENCOUNTER — Encounter (HOSPITAL_BASED_OUTPATIENT_CLINIC_OR_DEPARTMENT_OTHER): Payer: Self-pay | Admitting: Plastic Surgery

## 2020-09-19 ENCOUNTER — Ambulatory Visit (HOSPITAL_BASED_OUTPATIENT_CLINIC_OR_DEPARTMENT_OTHER): Payer: No Typology Code available for payment source | Admitting: Anesthesiology

## 2020-09-19 ENCOUNTER — Other Ambulatory Visit (HOSPITAL_COMMUNITY): Payer: Self-pay

## 2020-09-19 DIAGNOSIS — M549 Dorsalgia, unspecified: Secondary | ICD-10-CM | POA: Insufficient documentation

## 2020-09-19 DIAGNOSIS — N62 Hypertrophy of breast: Secondary | ICD-10-CM | POA: Diagnosis not present

## 2020-09-19 DIAGNOSIS — M546 Pain in thoracic spine: Secondary | ICD-10-CM | POA: Diagnosis not present

## 2020-09-19 DIAGNOSIS — M542 Cervicalgia: Secondary | ICD-10-CM | POA: Insufficient documentation

## 2020-09-19 DIAGNOSIS — Z8249 Family history of ischemic heart disease and other diseases of the circulatory system: Secondary | ICD-10-CM | POA: Diagnosis not present

## 2020-09-19 DIAGNOSIS — Z79899 Other long term (current) drug therapy: Secondary | ICD-10-CM | POA: Diagnosis not present

## 2020-09-19 DIAGNOSIS — G8929 Other chronic pain: Secondary | ICD-10-CM | POA: Diagnosis not present

## 2020-09-19 HISTORY — PX: BREAST REDUCTION SURGERY: SHX8

## 2020-09-19 LAB — POCT PREGNANCY, URINE: Preg Test, Ur: NEGATIVE

## 2020-09-19 SURGERY — MAMMOPLASTY, REDUCTION
Anesthesia: General | Site: Breast | Laterality: Bilateral

## 2020-09-19 MED ORDER — ONDANSETRON HCL 4 MG/2ML IJ SOLN
INTRAMUSCULAR | Status: AC
  Filled 2020-09-19: qty 2

## 2020-09-19 MED ORDER — SUCCINYLCHOLINE CHLORIDE 200 MG/10ML IV SOSY
PREFILLED_SYRINGE | INTRAVENOUS | Status: AC
  Filled 2020-09-19: qty 10

## 2020-09-19 MED ORDER — CHLORHEXIDINE GLUCONATE CLOTH 2 % EX PADS: 6.0000 | MEDICATED_PAD | Freq: Once | CUTANEOUS | Status: DC

## 2020-09-19 MED ORDER — MEPERIDINE HCL 25 MG/ML IJ SOLN
6.2500 mg | INTRAMUSCULAR | Status: DC | PRN
  Administered 2020-09-19: 6.25 mg via INTRAVENOUS

## 2020-09-19 MED ORDER — CEFAZOLIN SODIUM-DEXTROSE 2-4 GM/100ML-% IV SOLN
INTRAVENOUS | Status: AC
  Filled 2020-09-19: qty 100

## 2020-09-19 MED ORDER — PROPOFOL 10 MG/ML IV BOLUS
INTRAVENOUS | Status: DC | PRN
  Administered 2020-09-19: 180 mg via INTRAVENOUS

## 2020-09-19 MED ORDER — EPHEDRINE 5 MG/ML INJ
INTRAVENOUS | Status: AC
  Filled 2020-09-19: qty 10

## 2020-09-19 MED ORDER — ONDANSETRON HCL 4 MG/2ML IJ SOLN: 4.0000 mg | Freq: Once | INTRAMUSCULAR | Status: DC | PRN

## 2020-09-19 MED ORDER — KETOROLAC TROMETHAMINE 30 MG/ML IJ SOLN: 30.0000 mg | Freq: Once | INTRAMUSCULAR | Status: DC | PRN

## 2020-09-19 MED ORDER — LACTATED RINGERS IV SOLN: INTRAVENOUS | Status: DC

## 2020-09-19 MED ORDER — DROPERIDOL 2.5 MG/ML IJ SOLN
INTRAMUSCULAR | Status: DC | PRN
  Administered 2020-09-19: .625 mg via INTRAVENOUS

## 2020-09-19 MED ORDER — FENTANYL CITRATE (PF) 100 MCG/2ML IJ SOLN: 25.0000 ug | INTRAMUSCULAR | Status: DC | PRN

## 2020-09-19 MED ORDER — EPHEDRINE SULFATE 50 MG/ML IJ SOLN
INTRAMUSCULAR | Status: DC | PRN
  Administered 2020-09-19: 10 mg via INTRAVENOUS

## 2020-09-19 MED ORDER — DEXAMETHASONE SODIUM PHOSPHATE 10 MG/ML IJ SOLN
INTRAMUSCULAR | Status: AC
  Filled 2020-09-19: qty 1

## 2020-09-19 MED ORDER — CEFAZOLIN SODIUM-DEXTROSE 2-4 GM/100ML-% IV SOLN
2.0000 g | INTRAVENOUS | Status: AC
  Administered 2020-09-19: 2 g via INTRAVENOUS

## 2020-09-19 MED ORDER — MIDAZOLAM HCL 5 MG/5ML IJ SOLN
INTRAMUSCULAR | Status: DC | PRN
  Administered 2020-09-19: 2 mg via INTRAVENOUS

## 2020-09-19 MED ORDER — ONDANSETRON HCL 4 MG/2ML IJ SOLN
INTRAMUSCULAR | Status: DC | PRN
  Administered 2020-09-19: 4 mg via INTRAVENOUS

## 2020-09-19 MED ORDER — OXYCODONE HCL 5 MG PO TABS
ORAL_TABLET | ORAL | Status: AC
  Filled 2020-09-19: qty 1

## 2020-09-19 MED ORDER — LIDOCAINE-EPINEPHRINE 1 %-1:100000 IJ SOLN
INTRAMUSCULAR | Status: DC | PRN
  Administered 2020-09-19: 50 mL

## 2020-09-19 MED ORDER — OXYCODONE HCL 5 MG/5ML PO SOLN: 5.0000 mg | Freq: Once | ORAL | Status: AC | PRN

## 2020-09-19 MED ORDER — SUFENTANIL CITRATE 50 MCG/ML IV SOLN
INTRAVENOUS | Status: AC
  Filled 2020-09-19: qty 1

## 2020-09-19 MED ORDER — LIDOCAINE-EPINEPHRINE 1 %-1:100000 IJ SOLN
INTRAMUSCULAR | Status: AC
  Filled 2020-09-19: qty 2

## 2020-09-19 MED ORDER — PHENYLEPHRINE 40 MCG/ML (10ML) SYRINGE FOR IV PUSH (FOR BLOOD PRESSURE SUPPORT)
PREFILLED_SYRINGE | INTRAVENOUS | Status: AC
  Filled 2020-09-19: qty 10

## 2020-09-19 MED ORDER — LIDOCAINE HCL (CARDIAC) PF 100 MG/5ML IV SOSY
PREFILLED_SYRINGE | INTRAVENOUS | Status: DC | PRN
  Administered 2020-09-19: 40 mg via INTRAVENOUS

## 2020-09-19 MED ORDER — DEXAMETHASONE SODIUM PHOSPHATE 4 MG/ML IJ SOLN
INTRAMUSCULAR | Status: DC | PRN
  Administered 2020-09-19: 10 mg via INTRAVENOUS

## 2020-09-19 MED ORDER — LIDOCAINE HCL (PF) 1 % IJ SOLN
INTRAMUSCULAR | Status: AC
  Filled 2020-09-19: qty 60

## 2020-09-19 MED ORDER — BUPIVACAINE HCL (PF) 0.25 % IJ SOLN
INTRAMUSCULAR | Status: AC
  Filled 2020-09-19: qty 60

## 2020-09-19 MED ORDER — OXYCODONE HCL 5 MG PO TABS
5.0000 mg | ORAL_TABLET | Freq: Once | ORAL | Status: AC | PRN
  Administered 2020-09-19: 5 mg via ORAL

## 2020-09-19 MED ORDER — LIDOCAINE HCL (PF) 2 % IJ SOLN
INTRAMUSCULAR | Status: AC
  Filled 2020-09-19: qty 5

## 2020-09-19 MED ORDER — MEPERIDINE HCL 25 MG/ML IJ SOLN
INTRAMUSCULAR | Status: AC
  Filled 2020-09-19: qty 1

## 2020-09-19 MED ORDER — MIDAZOLAM HCL 2 MG/2ML IJ SOLN
INTRAMUSCULAR | Status: AC
  Filled 2020-09-19: qty 2

## 2020-09-19 MED ORDER — SUFENTANIL CITRATE 50 MCG/ML IV SOLN
INTRAVENOUS | Status: DC | PRN
  Administered 2020-09-19: 5 ug via INTRAVENOUS
  Administered 2020-09-19: 10 ug via INTRAVENOUS

## 2020-09-19 MED ORDER — ACETAMINOPHEN 325 MG PO TABS: 325.0000 mg | ORAL_TABLET | ORAL | Status: DC | PRN

## 2020-09-19 MED ORDER — ACETAMINOPHEN 160 MG/5ML PO SOLN: 325.0000 mg | ORAL | Status: DC | PRN

## 2020-09-19 MED ORDER — EPINEPHRINE PF 1 MG/ML IJ SOLN
INTRAMUSCULAR | Status: AC
  Filled 2020-09-19: qty 1

## 2020-09-19 SURGICAL SUPPLY — 74 items
ADH SKN CLS APL DERMABOND .7 (GAUZE/BANDAGES/DRESSINGS) ×2
BAG DECANTER FOR FLEXI CONT (MISCELLANEOUS) IMPLANT
BINDER BREAST LRG (GAUZE/BANDAGES/DRESSINGS) IMPLANT
BINDER BREAST MEDIUM (GAUZE/BANDAGES/DRESSINGS) IMPLANT
BINDER BREAST XLRG (GAUZE/BANDAGES/DRESSINGS) ×3 IMPLANT
BINDER BREAST XXLRG (GAUZE/BANDAGES/DRESSINGS) IMPLANT
BIOPATCH RED 1 DISK 7.0 (GAUZE/BANDAGES/DRESSINGS) IMPLANT
BIOPATCH RED 1IN DISK 7.0MM (GAUZE/BANDAGES/DRESSINGS)
BLADE HEX COATED 2.75 (ELECTRODE) ×3 IMPLANT
BLADE KNIFE PERSONA 10 (BLADE) ×6 IMPLANT
BLADE SURG 15 STRL LF DISP TIS (BLADE) IMPLANT
BLADE SURG 15 STRL SS (BLADE)
BNDG GAUZE ELAST 4 BULKY (GAUZE/BANDAGES/DRESSINGS) IMPLANT
CANISTER SUCT 1200ML W/VALVE (MISCELLANEOUS) ×3 IMPLANT
COVER BACK TABLE 60X90IN (DRAPES) ×3 IMPLANT
COVER MAYO STAND STRL (DRAPES) ×3 IMPLANT
COVER WAND RF STERILE (DRAPES) IMPLANT
DECANTER SPIKE VIAL GLASS SM (MISCELLANEOUS) IMPLANT
DERMABOND ADVANCED (GAUZE/BANDAGES/DRESSINGS) ×4
DERMABOND ADVANCED .7 DNX12 (GAUZE/BANDAGES/DRESSINGS) ×2 IMPLANT
DRAIN CHANNEL 19F RND (DRAIN) IMPLANT
DRAPE LAPAROSCOPIC ABDOMINAL (DRAPES) ×3 IMPLANT
DRSG OPSITE POSTOP 4X6 (GAUZE/BANDAGES/DRESSINGS) ×6 IMPLANT
DRSG PAD ABDOMINAL 8X10 ST (GAUZE/BANDAGES/DRESSINGS) ×6 IMPLANT
ELECT BLADE 4.0 EZ CLEAN MEGAD (MISCELLANEOUS)
ELECT REM PT RETURN 9FT ADLT (ELECTROSURGICAL) ×3
ELECTRODE BLDE 4.0 EZ CLN MEGD (MISCELLANEOUS) IMPLANT
ELECTRODE REM PT RTRN 9FT ADLT (ELECTROSURGICAL) ×1 IMPLANT
EVACUATOR SILICONE 100CC (DRAIN) IMPLANT
GLOVE SURG ENC MOIS LTX SZ6.5 (GLOVE) ×12 IMPLANT
GLOVE SURG ENC MOIS LTX SZ7.5 (GLOVE) ×3 IMPLANT
GLOVE SURG POLYISO LF SZ7 (GLOVE) ×3 IMPLANT
GLOVE SURG UNDER POLY LF SZ7 (GLOVE) ×3 IMPLANT
GOWN STRL REUS W/ TWL LRG LVL3 (GOWN DISPOSABLE) ×2 IMPLANT
GOWN STRL REUS W/ TWL XL LVL3 (GOWN DISPOSABLE) ×1 IMPLANT
GOWN STRL REUS W/TWL LRG LVL3 (GOWN DISPOSABLE) ×6
GOWN STRL REUS W/TWL XL LVL3 (GOWN DISPOSABLE) ×3
NDL SAFETY ECLIPSE 18X1.5 (NEEDLE) IMPLANT
NEEDLE FILTER BLUNT 18X 1/2SAF (NEEDLE)
NEEDLE FILTER BLUNT 18X1 1/2 (NEEDLE) IMPLANT
NEEDLE HYPO 18GX1.5 SHARP (NEEDLE)
NEEDLE HYPO 25X1 1.5 SAFETY (NEEDLE) ×3 IMPLANT
NS IRRIG 1000ML POUR BTL (IV SOLUTION) ×3 IMPLANT
PACK BASIN DAY SURGERY FS (CUSTOM PROCEDURE TRAY) ×3 IMPLANT
PAD ALCOHOL SWAB (MISCELLANEOUS) IMPLANT
PAD FOAM SILICONE BACKED (GAUZE/BANDAGES/DRESSINGS) IMPLANT
PENCIL SMOKE EVACUATOR (MISCELLANEOUS) ×3 IMPLANT
PIN SAFETY STERILE (MISCELLANEOUS) IMPLANT
SLEEVE SCD COMPRESS KNEE MED (STOCKING) ×3 IMPLANT
SPONGE LAP 18X18 RF (DISPOSABLE) ×9 IMPLANT
STRIP SUTURE WOUND CLOSURE 1/2 (MISCELLANEOUS) ×6 IMPLANT
SUT MNCRL AB 4-0 PS2 18 (SUTURE) ×15 IMPLANT
SUT MON AB 3-0 SH 27 (SUTURE) ×15
SUT MON AB 3-0 SH27 (SUTURE) ×5 IMPLANT
SUT MON AB 5-0 PS2 18 (SUTURE) ×6 IMPLANT
SUT PDS 3-0 CT2 (SUTURE) ×12
SUT PDS AB 2-0 CT2 27 (SUTURE) IMPLANT
SUT PDS II 3-0 CT2 27 ABS (SUTURE) ×4 IMPLANT
SUT SILK 3 0 PS 1 (SUTURE) IMPLANT
SUT VIC AB 3-0 SH 27 (SUTURE)
SUT VIC AB 3-0 SH 27X BRD (SUTURE) IMPLANT
SUT VICRYL 4-0 PS2 18IN ABS (SUTURE) IMPLANT
SYR 50ML LL SCALE MARK (SYRINGE) IMPLANT
SYR BULB IRRIG 60ML STRL (SYRINGE) ×3 IMPLANT
SYR CONTROL 10ML LL (SYRINGE) ×3 IMPLANT
TAPE MEASURE VINYL STERILE (MISCELLANEOUS) IMPLANT
TOWEL GREEN STERILE FF (TOWEL DISPOSABLE) ×6 IMPLANT
TRAY DSU PREP LF (CUSTOM PROCEDURE TRAY) ×3 IMPLANT
TUBE CONNECTING 20'X1/4 (TUBING) ×1
TUBE CONNECTING 20X1/4 (TUBING) ×2 IMPLANT
TUBING INFILTRATION IT-10001 (TUBING) IMPLANT
TUBING SET GRADUATE ASPIR 12FT (MISCELLANEOUS) IMPLANT
UNDERPAD 30X36 HEAVY ABSORB (UNDERPADS AND DIAPERS) IMPLANT
YANKAUER SUCT BULB TIP NO VENT (SUCTIONS) ×3 IMPLANT

## 2020-09-19 NOTE — Op Note (Signed)
Breast Reduction Op note:    DATE OF PROCEDURE: 09/19/2020  LOCATION: [facility]  SURGEON: Lyndee Leo Sanger Vennie Waymire, DO  ASSISTANT: Roetta Sessions, PA  PREOPERATIVE DIAGNOSIS 1. Macromastia 2. Neck Pain 3. Back Pain  POSTOPERATIVE DIAGNOSIS 1. Macromastia 2. Neck Pain 3. Back Pain  PROCEDURES 1. Bilateral breast reduction.  Right reduction 585 g, Left reduction 253 g  COMPLICATIONS: None.  DRAINS: none  INDICATIONS FOR PROCEDURE Kristin Smith is a 40 y.o. year-old female born on 1981/03/15,with a history of symptomatic macromastia with concominant back pain, neck pain, shoulder grooving from her bra.   MRN: [id number]  CONSENT Informed consent was obtained directly from the patient. The risks, benefits and alternatives were fully discussed. Specific risks including but not limited to bleeding, infection, hematoma, seroma, scarring, pain, nipple necrosis, asymmetry, poor cosmetic results, and need for further surgery were discussed. The patient had ample opportunity to have her questions answered to her satisfaction.  DESCRIPTION OF PROCEDURE  Patient was brought into the operating room and placed in a supine position.  SCDs were placed and appropriate padding was performed.  Antibiotics were given. The patient underwent general anesthesia and the chest was prepped and draped in a sterile fashion.  A timeout was performed and all information was confirmed to be correct.  Right side: Preoperative markings were confirmed.  Incision lines were injected with local with epinephrine.  After waiting for vasoconstriction, the marked lines were incised.  A Wise-pattern superomedial breast reduction was performed by de-epithelializing the pedicle, using bovie to create the superomedial pedicle, and removing breast tissue from the superior, lateral, and inferior portions of the breast.  Care was taken to not undermine the breast pedicle. Hemostasis was achieved.   The nipple was gently rotated into position and the soft tissue closed with 4-0 Monocryl.   The pocket was irrigated and hemostasis confirmed.  The deep tissues were approximated with 3-0 PDS and Monocryl sutures and the skin was closed with deep dermal and subcuticular 4-0 Monocryl sutures.  The nipple and skin flaps had good capillary refill at the end of the procedure.    Left side: Preoperative markings were confirmed.  Incision lines were injected with local with epinephrine.  After waiting for vasoconstriction, the marked lines were incised.  A Wise-pattern superomedial breast reduction was performed by de-epithelializing the pedicle, using bovie to create the superomedial pedicle, and removing breast tissue from the superior, lateral, and inferior portions of the breast.  Care was taken to not undermine the breast pedicle. Hemostasis was achieved.  The nipple was gently rotated into position and the soft tissue was closed with 4-0 Monocryl.  The patient was sat upright and size and shape symmetry was confirmed.  The pocket was irrigated and hemostasis confirmed.  The deep tissues were approximated with 3-0 PDS and Monocryl sutures and the skin was closed with deep dermal and subcuticular 4-0 Monocryl sutures.  Dermabond was applied.  A breast binder and ABDs were placed.  The nipple and skin flaps had good capillary refill at the end of the procedure.  The patient tolerated the procedure well. The patient was allowed to wake from anesthesia and taken to the recovery room in satisfactory condition.  The advanced practice practitioner (APP) assisted throughout the case.  The APP was essential in retraction and counter traction when needed to make the case progress smoothly.  This retraction and assistance made it possible to see the tissue plans for the procedure.  The assistance was  needed for blood control, tissue re-approximation and assisted with closure of the incision site.

## 2020-09-19 NOTE — Transfer of Care (Signed)
Immediate Anesthesia Transfer of Care Note  Patient: Kristin Smith  Procedure(s) Performed: BILATERAL MAMMARY REDUCTION  (BREAST) (Bilateral: Breast)  Patient Location: PACU  Anesthesia Type:General  Level of Consciousness: sedated  Airway & Oxygen Therapy: Patient Spontanous Breathing and Patient connected to face mask oxygen  Post-op Assessment: Report given to RN and Post -op Vital signs reviewed and stable  Post vital signs: Reviewed and stable  Last Vitals:  Vitals Value Taken Time  BP    Temp    Pulse    Resp    SpO2      Last Pain:  Vitals:   09/19/20 0639  TempSrc: Oral  PainSc: 0-No pain         Complications: No notable events documented.

## 2020-09-19 NOTE — Interval H&P Note (Signed)
History and Physical Interval Note:  09/19/2020 7:17 AM  Kristin Smith  has presented today for surgery, with the diagnosis of mammary hypertrophy.  The various methods of treatment have been discussed with the patient and family. After consideration of risks, benefits and other options for treatment, the patient has consented to  Procedure(s) with comments: BREAST REDUCTION WITH LIPOSUCTION (Bilateral) - 3 hours as a surgical intervention.  The patient's history has been reviewed, patient examined, no change in status, stable for surgery.  I have reviewed the patient's chart and labs.  Questions were answered to the patient's satisfaction.     Loel Lofty Kerstie Agent

## 2020-09-19 NOTE — Anesthesia Postprocedure Evaluation (Signed)
Anesthesia Post Note  Patient: Kristin Smith  Procedure(s) Performed: BILATERAL MAMMARY REDUCTION  (BREAST) (Bilateral: Breast)     Patient location during evaluation: PACU Anesthesia Type: General Level of consciousness: awake and sedated Pain management: pain level controlled Vital Signs Assessment: post-procedure vital signs reviewed and stable Respiratory status: spontaneous breathing Cardiovascular status: stable Postop Assessment: no apparent nausea or vomiting Anesthetic complications: no   No notable events documented.  Last Vitals:  Vitals:   09/19/20 1000 09/19/20 1015  BP: 126/84 129/69  Pulse: (!) 105 93  Resp: 17 16  Temp:    SpO2: 100% 97%    Last Pain:  Vitals:   09/19/20 1015  TempSrc:   PainSc: [place] Jr

## 2020-09-19 NOTE — Discharge Instructions (Addendum)
INSTRUCTIONS FOR AFTER SURGERY   You will likely have some questions about what to expect following your operation.  The following information will help you and your family understand what to expect when you are discharged from the hospital.  Following these guidelines will help ensure a smooth recovery and reduce risks of complications.  Postoperative instructions include information on: diet, wound care, medications and physical activity.  AFTER SURGERY Expect to go home after the procedure.  In some cases, you may need to spend one night in the hospital for observation.  DIET This surgery does not require a specific diet.  However, I have to mention that the healthier you eat the better your body can start healing. It is important to increasing your protein intake.  This means limiting the foods with added sugar.  Focus on fruits and vegetables and some meat. It is very important to drink water after your surgery.  If your urine is bright yellow, then it is concentrated, and you need to drink more water.  As a general rule after surgery, you should have 8 ounces of water every hour while awake.  If you find you are persistently nauseated or unable to take in liquids let us know.  NO TOBACCO USE or EXPOSURE.  This will slow your healing process and increase the risk of a wound.  WOUND CARE If you don't have a drain: You can shower the day after surgery.  Use fragrance free soap.  Dial, [place], [place] and Cetaphil are usually mild on the skin.  If you have steri-strips / tape directly attached to your skin leave them in place. It is OK to get these wet.  No baths, pools or hot tubs for two weeks. We close your incision to leave the smallest and best-looking scar. No ointment or creams on your incisions until given the go ahead.  Especially not Neosporin (Too many skin reactions with this one).  A few weeks after surgery you can use Mederma and start massaging the scar. We ask you to wear your binder or  sports bra for the first 6 weeks around the clock, including while sleeping. This provides added comfort and helps reduce the fluid accumulation at the surgery site.  ACTIVITY No heavy lifting until cleared by the doctor.  It is OK to walk and climb stairs. In fact, moving your legs is very important to decrease your risk of a blood clot.  It will also help keep you from getting deconditioned.  Every 1 to 2 hours get up and walk for 5 minutes. This will help with a quicker recovery back to normal.  Let pain be your guide so you don't do too much.  NO, you cannot do the spring cleaning and don't plan on taking care of anyone else.  This is your time for TLC.   WORK Everyone returns to work at different times. As a rough guide, most people take at least 1 - 2 weeks off prior to returning to work. If you need documentation for your job, bring the forms to your postoperative follow up visit.  DRIVING Arrange for someone to bring you home from the hospital.  You may be able to drive a few days after surgery but not while taking any narcotics or valium.  BOWEL MOVEMENTS Constipation can occur after anesthesia and while taking pain medication.  It is important to stay ahead for your comfort.  We recommend taking Milk of Magnesia (2 tablespoons; twice a day) while taking  the pain pills.  SEROMA This is fluid your body tried to put in the surgical site.  This is normal but if it creates excessive pain and swelling let us know.  It usually decreases in a few weeks.  MEDICATIONS and PAIN CONTROL At your preoperative visit for you history and physical you were given the following medications: An antibiotic: Start this medication when you get home and take according to the instructions on the bottle. Zofran 4 mg:  This is to treat nausea and vomiting.  You can take this every 6 hours as needed and only if needed. Norco (hydrocodone/acetaminophen) 5/325 mg:  This is only to be used after you have taken the  motrin or the tylenol. Every 8 hours as needed. Over the counter Medication to take: Ibuprofen (Motrin) 600 mg:  Take this every 6 hours.  If you have additional pain then take 500 mg of the tylenol.  Only take the Norco after you have tried these two. Miralax or stool softener of choice: Take this according to the bottle if you take the [place] Call your surgeon's office if any of the following occur:  Fever 101 degrees F or greater  Excessive bleeding or fluid from the incision site.  Pain that increases over time without aid from the medications  Redness, warmth, or pus draining from incision sites  Persistent nausea or inability to take in liquids  Severe misshapen area that underwent the operation.   Post Anesthesia Home Care Instructions  Activity: Get plenty of rest for the remainder of the day. A responsible individual must stay with you for 24 hours following the procedure.  For the next 24 hours, DO NOT: -Drive a car -Paediatric nurse -Drink alcoholic beverages -Take any medication unless instructed by your physician -Make any legal decisions or sign important papers.  Meals: Start with liquid foods such as gelatin or soup. Progress to regular foods as tolerated. Avoid greasy, spicy, heavy foods. If nausea and/or vomiting occur, drink only clear liquids until the nausea and/or vomiting subsides. Call your physician if vomiting continues.  Special Instructions/Symptoms: Your throat may feel dry or sore from the anesthesia or the breathing tube placed in your throat during surgery. If this causes discomfort, gargle with warm salt water. The discomfort should disappear within 24 hours.  If you had a scopolamine patch placed behind your ear for the management of post- operative nausea and/or vomiting:  1. The medication in the patch is effective for 72 hours, after which it should be removed.  Wrap patch in a tissue and discard in the trash. Wash hands thoroughly  with soap and water. 2. You may remove the patch earlier than 72 hours if you experience unpleasant side effects which may include dry mouth, dizziness or visual disturbances. 3. Avoid touching the patch. Wash your hands with soap and water after contact with the patch.

## 2020-09-19 NOTE — Addendum Note (Signed)
Addendum  created 09/19/20 1036 by Willa Frater, CRNA   Charge Capture section accepted

## 2020-09-19 NOTE — Anesthesia Procedure Notes (Signed)
Procedure Name: LMA Insertion Date/Time: 09/19/2020 7:37 AM Performed by: Willa Frater, CRNA Pre-anesthesia Checklist: Patient identified, Emergency Drugs available, Suction available and Patient being monitored Patient Re-evaluated:Patient Re-evaluated prior to induction Oxygen Delivery Method: Circle system utilized Preoxygenation: Pre-oxygenation with 100% oxygen Induction Type: IV induction Ventilation: Mask ventilation without difficulty LMA: LMA inserted LMA Size: 4.0 Number of attempts: 1 Airway Equipment and Method: Bite block Placement Confirmation: positive ETCO2 Tube secured with: Tape Dental Injury: Teeth and Oropharynx as per pre-operative assessment

## 2020-09-20 LAB — SURGICAL PATHOLOGY

## 2020-09-22 ENCOUNTER — Encounter (HOSPITAL_BASED_OUTPATIENT_CLINIC_OR_DEPARTMENT_OTHER): Payer: Self-pay | Admitting: Plastic Surgery

## 2020-09-26 ENCOUNTER — Other Ambulatory Visit: Payer: Self-pay

## 2020-09-26 ENCOUNTER — Ambulatory Visit (INDEPENDENT_AMBULATORY_CARE_PROVIDER_SITE_OTHER): Payer: No Typology Code available for payment source | Admitting: Surgical

## 2020-09-26 DIAGNOSIS — N62 Hypertrophy of breast: Secondary | ICD-10-CM

## 2020-09-26 NOTE — Progress Notes (Signed)
Patient is a 40 year old female here for follow-up after bilateral breast reduction with Dr. Marla Roe on 09/19/2020.  She is 1 week postop.  She had 585 g removed from the right breast and 550 g removed from the left breast. Of note she does have a history of hypertrophic scarring.  Patient reports that she is doing well.  She has some tenderness of bilateral breasts but is overall feeling well.  She is not taking any more narcotics.  She reports no fevers, chills, nausea, vomiting.  Bowel movements have been normal.  Chaperone present on exam On exam bilateral breast incisions are intact.  Bilateral NAC's are viable.  No wounds are noted.  Steri-Strips and honeycomb dressings are still in place.  There is no drainage noted on the gauze.  There is a little bit of swelling of bilateral breasts as expected.  No large fluid collections noted.  Skin is not taut or very firm.  Recommend continue use of compressive garment 24/7.  Avoid strenuous activities or heavy lifting.  There is no sign of infection, hematoma.  Slight small seroma was present, however too early to attempt aspiration.  Recommend following up in 1 to 2 weeks for reevaluation.  Call with questions or concerns.

## 2020-09-27 ENCOUNTER — Encounter: Payer: No Typology Code available for payment source | Admitting: Surgical

## 2020-10-11 ENCOUNTER — Encounter: Payer: Self-pay | Admitting: Plastic Surgery

## 2020-10-11 ENCOUNTER — Ambulatory Visit (INDEPENDENT_AMBULATORY_CARE_PROVIDER_SITE_OTHER): Payer: No Typology Code available for payment source | Admitting: Plastic Surgery

## 2020-10-11 ENCOUNTER — Other Ambulatory Visit: Payer: Self-pay

## 2020-10-11 DIAGNOSIS — N62 Hypertrophy of breast: Secondary | ICD-10-CM

## 2020-10-11 NOTE — Progress Notes (Signed)
The patient is a 40 year old female here for follow-up on her bilateral breast reduction.  She is doing extremely well.  She is very happy with her results.  There is no sign of hematoma or infection.  The incisions are intact.  Her pain is well controlled.  Follow-up in 2 to 3 weeks.  Pictures were obtained of the patient and placed in the chart with the patient's or guardian's permission.

## 2020-10-29 ENCOUNTER — Ambulatory Visit: Payer: No Typology Code available for payment source | Admitting: Plastic Surgery

## 2020-11-05 ENCOUNTER — Ambulatory Visit (INDEPENDENT_AMBULATORY_CARE_PROVIDER_SITE_OTHER): Payer: No Typology Code available for payment source | Admitting: Surgical

## 2020-11-05 ENCOUNTER — Other Ambulatory Visit: Payer: Self-pay

## 2020-11-05 DIAGNOSIS — N62 Hypertrophy of breast: Secondary | ICD-10-CM

## 2020-11-05 NOTE — Progress Notes (Signed)
Patient is a 40 year old female here for follow-up after bilateral breast reduction with Dr. Marla Roe on 09/19/2020.  She is approximately 7 weeks postop.  She reports overall she is doing really well.  She reports that swelling has improved and everything appears to be symmetric to her.  She has some questions about returning to exercise.  Chaperone present on exam On exam bilateral breast incisions are intact, bilateral NAC's are viable.  No wounds are noted.  Incisions are well-healed.  No erythema or cellulitic changes are noted.  No restrictions.  Recommend continue her compressive garment/sports bra throughout the day for 1 more month then can transition to wearing a normal bra without an underwire.  There is no sign of infection, seroma, hematoma.  Recommend calling with questions or concerns and following up as needed.  Picture was taken and placed in patient's chart with patient's permission.

## 2020-11-13 ENCOUNTER — Other Ambulatory Visit (HOSPITAL_COMMUNITY): Payer: Self-pay

## 2020-11-14 ENCOUNTER — Other Ambulatory Visit (HOSPITAL_COMMUNITY): Payer: Self-pay

## 2021-01-08 ENCOUNTER — Other Ambulatory Visit (HOSPITAL_COMMUNITY): Payer: Self-pay

## 2021-01-20 ENCOUNTER — Other Ambulatory Visit (HOSPITAL_COMMUNITY): Payer: Self-pay

## 2021-01-20 MED ORDER — DAPSONE 5 % EX GEL
CUTANEOUS | 3 refills | Status: AC
  Filled 2021-01-20: qty 90, 30d supply, fill #0
  Filled 2021-04-02: qty 90, 30d supply, fill #1
  Filled 2021-07-31: qty 90, 30d supply, fill #2

## 2021-01-28 ENCOUNTER — Other Ambulatory Visit (HOSPITAL_COMMUNITY): Payer: Self-pay

## 2021-04-02 ENCOUNTER — Other Ambulatory Visit (HOSPITAL_COMMUNITY): Payer: Self-pay

## 2021-04-02 ENCOUNTER — Other Ambulatory Visit: Payer: Self-pay | Admitting: Internal Medicine

## 2021-04-02 DIAGNOSIS — F411 Generalized anxiety disorder: Secondary | ICD-10-CM

## 2021-04-02 MED ORDER — SERTRALINE HCL 100 MG PO TABS
100.0000 mg | ORAL_TABLET | Freq: Every day | ORAL | 1 refills | Status: AC
  Filled 2021-04-02: qty 90, 90d supply, fill #0

## 2021-04-03 ENCOUNTER — Other Ambulatory Visit (HOSPITAL_COMMUNITY): Payer: Self-pay

## 2021-05-11 IMAGING — MG DIGITAL DIAGNOSTIC BILAT W/ TOMO W/ CAD
6 of 10 series · 6 of 30 positions shown · non-contrast
Comparison: Previous exam(s).

CLINICAL DATA: 39-year-old presenting with a possible palpable lump
involving the UPPER OUTER periareolar RIGHT breast. Annual
evaluation, LEFT breast.

Personal history of multiple benign excisional biopsies from both
breasts.
EXAM:
DIGITAL DIAGNOSTIC BILATERAL MAMMOGRAM WITH CAD AND TOMO
ULTRASOUND RIGHT BREAST

[R CC synth-2D]
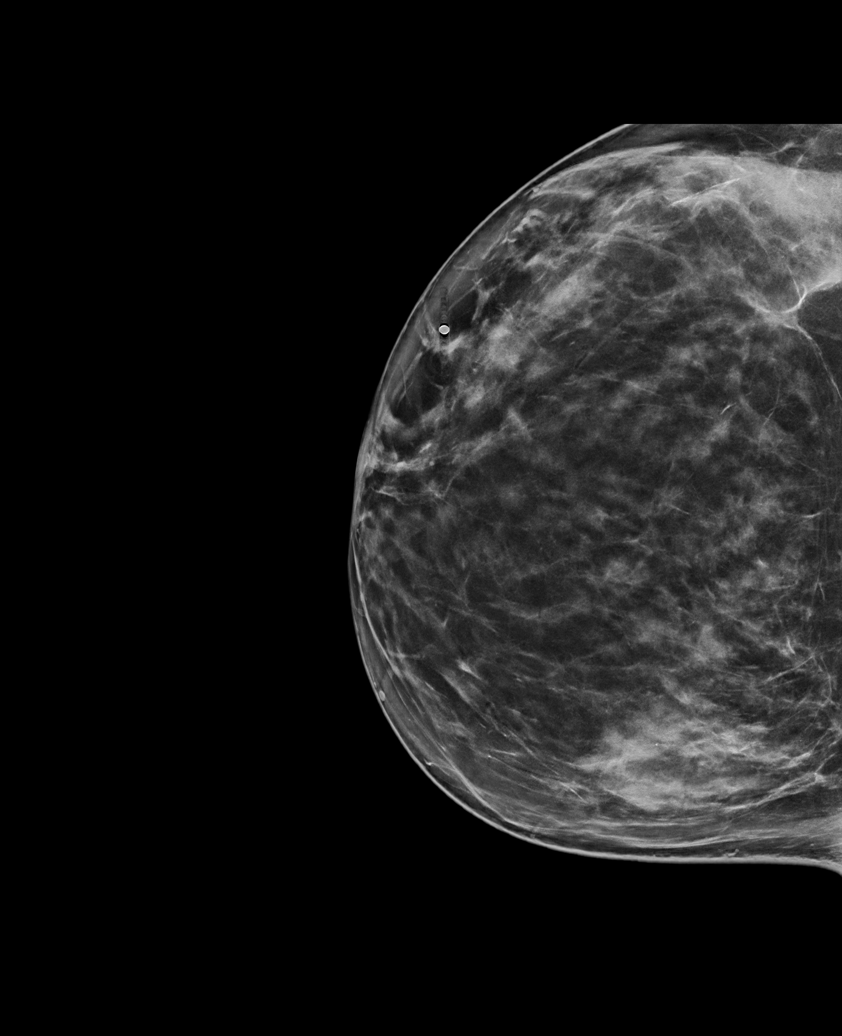

[L MLO synth-2D]
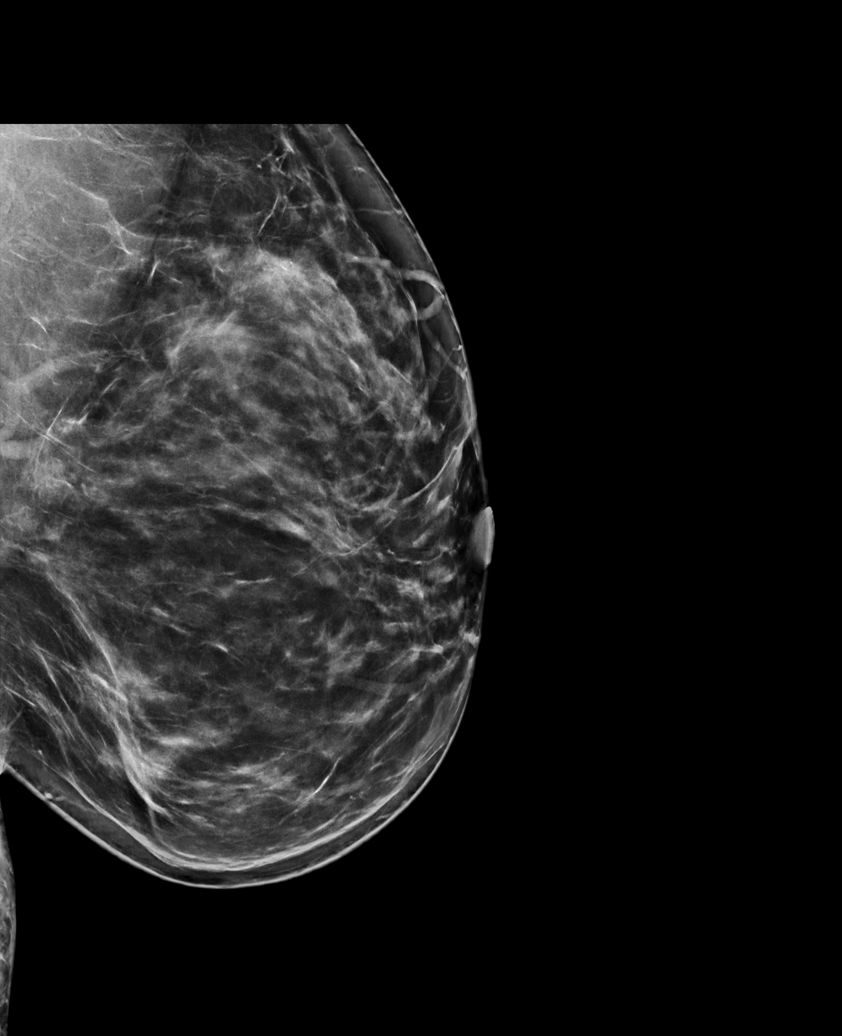

[R MLO synth-2D (1 of 2)]
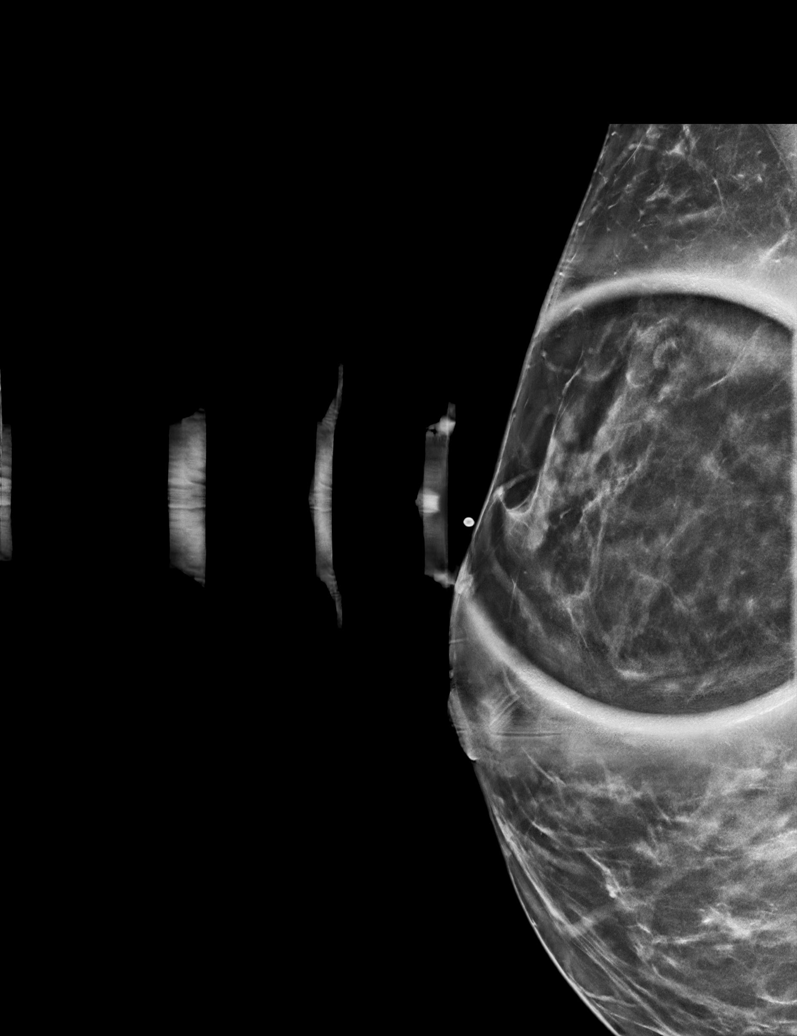

[L CC synth-2D]
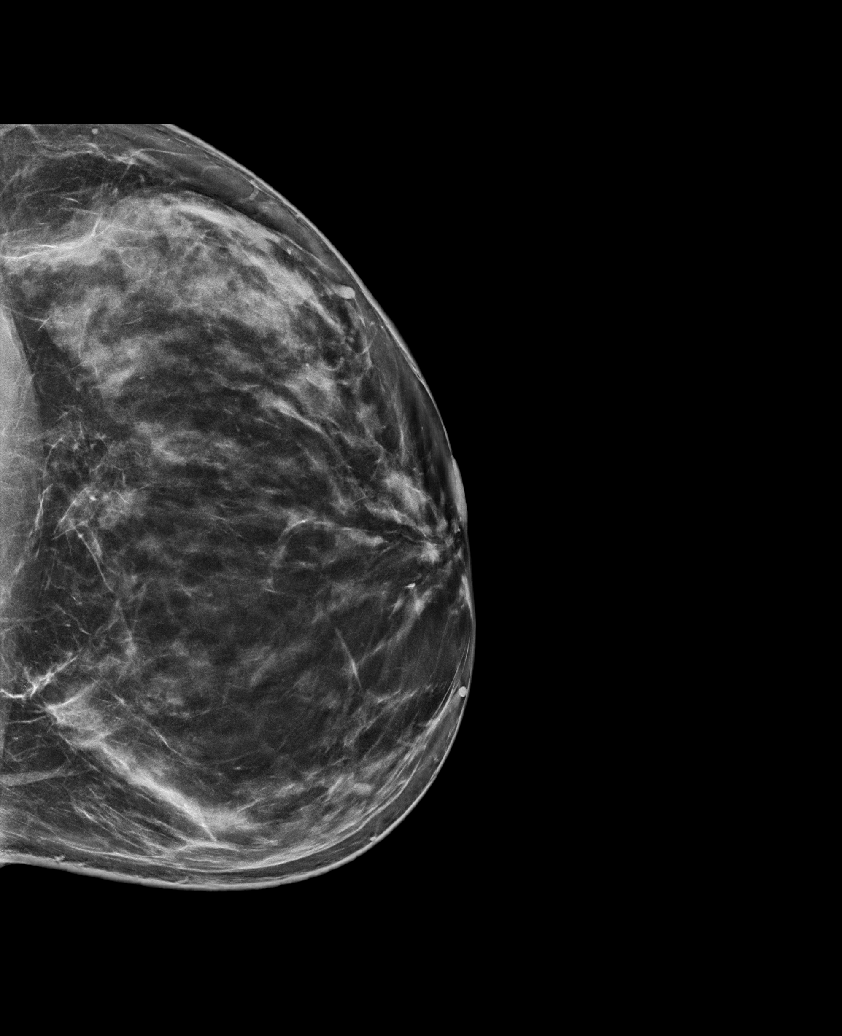

[R MLO synth-2D (2 of 2)]
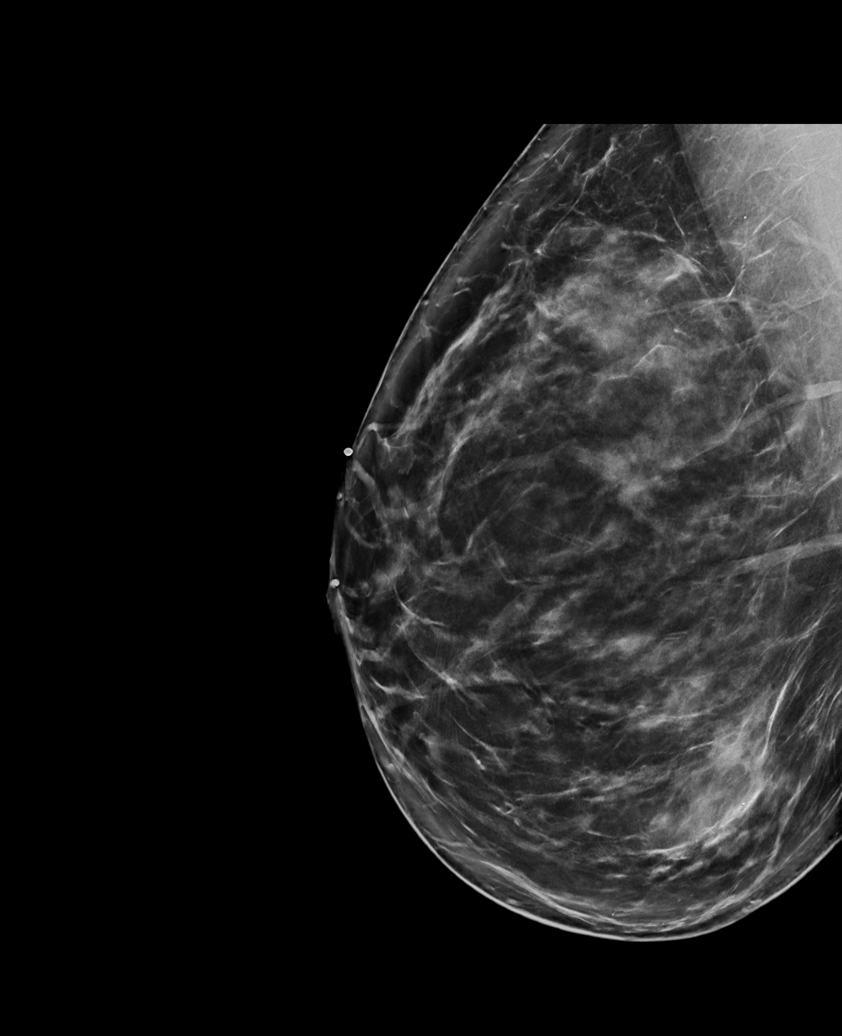

[L MLO tomo · tomo slice 47/92.0]
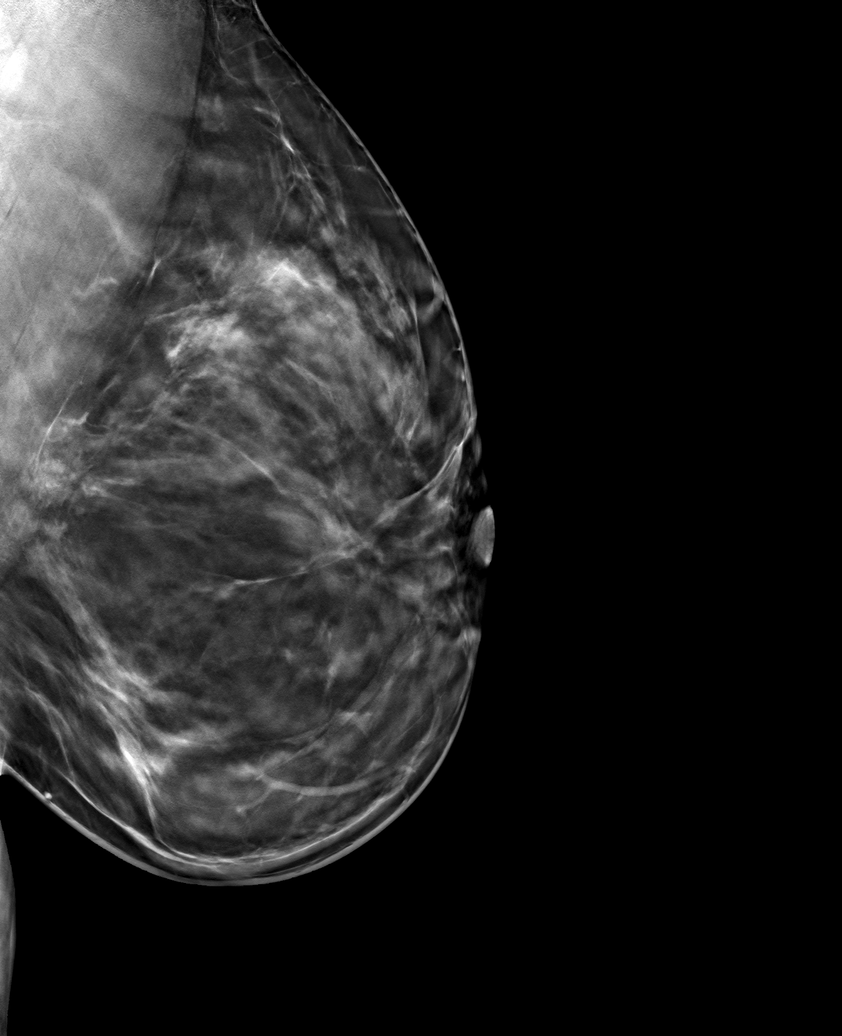

[6 of 30 positions shown; findings below may reference images not displayed]

ACR Breast Density Category c: The breast tissue is heterogeneously
dense, which may obscure small masses.
FINDINGS: Tomosynthesis and synthesized full field CC and MLO views of both
breasts were obtained. Tomosynthesis and synthesized spot
compression tangential view of the area of concern in the RIGHT
breast was also obtained. Mammographic images were processed with
CAD.

Corresponding to the palpable concern in the RIGHT breast is a
possible obscured superficial isodense mass or focally dense
fibroglandular with an oval configuration, new since the prior
mammogram in 1111. There is no associated architectural distortion
or suspicious calcifications. No suspicious findings elsewhere in
the RIGHT breast.

No findings suspicious for malignancy in the LEFT breast.

On correlative physical exam, there is a superficial palpable lump
in the UPPER OUTER periareolar location corresponding to what the
patient is feeling.

Targeted RIGHT breast ultrasound is performed, showing a
heterogeneous though predominantly hypoechoic mass superficially at
the 10 o'clock position approximately 3 cm from nipple measuring
approximately 1.7 x 0.8 x 1.1 cm, with internal cystic spaces and
with a peak that extends to the dermal surface of the skin. The mass
demonstrates slight posterior acoustic enhancement and demonstrates
internal power Doppler flow.

Sonographic evaluation of the RIGHT axilla demonstrates no
pathologic lymphadenopathy.
IMPRESSION: 1. Indeterminate new 1.7 cm mass superficially in the UPPER OUTER
QUADRANT of the RIGHT breast at the 10 o'clock position
approximately 3 cm from the nipple.
2. No pathologic RIGHT axillary lymphadenopathy.
3. No mammographic evidence of malignancy involving the LEFT breast.

RECOMMENDATION:
Ultrasound-guided core needle biopsy of the new indeterminate RIGHT
breast mass.

The core needle biopsy procedure was discussed with patient and her
questions were answered. She wishes to proceed and the biopsy has
been scheduled at her convenience.

I have discussed the findings and recommendations with the patient.

BI-RADS CATEGORY  4: Suspicious.

## 2021-07-31 ENCOUNTER — Other Ambulatory Visit (HOSPITAL_COMMUNITY): Payer: Self-pay

## 2021-08-01 ENCOUNTER — Other Ambulatory Visit (HOSPITAL_COMMUNITY): Payer: Self-pay

## 2021-08-07 ENCOUNTER — Other Ambulatory Visit (HOSPITAL_COMMUNITY): Payer: Self-pay

## 2021-09-05 ENCOUNTER — Other Ambulatory Visit (HOSPITAL_COMMUNITY): Payer: Self-pay

## 2021-09-06 ENCOUNTER — Other Ambulatory Visit (HOSPITAL_COMMUNITY): Payer: Self-pay
